# Patient Record
Sex: Female | Born: 1975 | Race: White | Hispanic: No | Marital: Married | Smoking: Never smoker
Health system: Southern US, Community
[De-identification: ages and names within clinical notes are randomized; demographics above are authoritative.]

## PROBLEM LIST (undated history)

## (undated) DIAGNOSIS — E039 Hypothyroidism, unspecified: Secondary | ICD-10-CM

## (undated) DIAGNOSIS — Z9889 Other specified postprocedural states: Secondary | ICD-10-CM

## (undated) DIAGNOSIS — G4700 Insomnia, unspecified: Secondary | ICD-10-CM

## (undated) DIAGNOSIS — F419 Anxiety disorder, unspecified: Secondary | ICD-10-CM

## (undated) DIAGNOSIS — E063 Autoimmune thyroiditis: Secondary | ICD-10-CM

## (undated) DIAGNOSIS — Z87898 Personal history of other specified conditions: Secondary | ICD-10-CM

## (undated) DIAGNOSIS — G43909 Migraine, unspecified, not intractable, without status migrainosus: Secondary | ICD-10-CM

## (undated) DIAGNOSIS — T4145XA Adverse effect of unspecified anesthetic, initial encounter: Secondary | ICD-10-CM

## (undated) DIAGNOSIS — R112 Nausea with vomiting, unspecified: Secondary | ICD-10-CM

## (undated) DIAGNOSIS — R102 Pelvic and perineal pain: Secondary | ICD-10-CM

## (undated) DIAGNOSIS — T8859XA Other complications of anesthesia, initial encounter: Secondary | ICD-10-CM

## (undated) DIAGNOSIS — N920 Excessive and frequent menstruation with regular cycle: Secondary | ICD-10-CM

## (undated) HISTORY — PX: WISDOM TOOTH EXTRACTION: SHX21

## (undated) HISTORY — PX: BREAST REDUCTION SURGERY: SHX8

---

## 2000-08-14 ENCOUNTER — Encounter: Payer: Self-pay | Admitting: Family Medicine

## 2000-08-14 ENCOUNTER — Encounter: Admission: RE | Admit: 2000-08-14 | Discharge: 2000-08-14 | Payer: Self-pay | Admitting: Family Medicine

## 2000-09-01 ENCOUNTER — Emergency Department (HOSPITAL_COMMUNITY): Admission: EM | Admit: 2000-09-01 | Discharge: 2000-09-01 | Payer: Self-pay | Admitting: Emergency Medicine

## 2000-09-01 ENCOUNTER — Encounter: Payer: Self-pay | Admitting: Emergency Medicine

## 2001-01-08 ENCOUNTER — Other Ambulatory Visit: Admission: RE | Admit: 2001-01-08 | Discharge: 2001-01-08 | Payer: Self-pay | Admitting: Family Medicine

## 2001-12-26 ENCOUNTER — Encounter: Admission: RE | Admit: 2001-12-26 | Discharge: 2001-12-26 | Payer: Self-pay | Admitting: Family Medicine

## 2001-12-26 ENCOUNTER — Encounter: Payer: Self-pay | Admitting: Family Medicine

## 2003-05-18 ENCOUNTER — Emergency Department (HOSPITAL_COMMUNITY): Admission: EM | Admit: 2003-05-18 | Discharge: 2003-05-18 | Payer: Self-pay | Admitting: Internal Medicine

## 2003-11-07 ENCOUNTER — Ambulatory Visit (HOSPITAL_COMMUNITY): Admission: RE | Admit: 2003-11-07 | Discharge: 2003-11-07 | Payer: Self-pay | Admitting: Oral Surgery

## 2003-11-07 HISTORY — PX: NASAL SEPTUM SURGERY: SHX37

## 2004-11-04 ENCOUNTER — Inpatient Hospital Stay (HOSPITAL_COMMUNITY): Admission: AD | Admit: 2004-11-04 | Discharge: 2004-11-04 | Payer: Self-pay | Admitting: *Deleted

## 2004-11-04 ENCOUNTER — Inpatient Hospital Stay (HOSPITAL_COMMUNITY): Admission: AD | Admit: 2004-11-04 | Discharge: 2004-11-04 | Payer: Self-pay | Admitting: Obstetrics & Gynecology

## 2004-11-05 ENCOUNTER — Inpatient Hospital Stay (HOSPITAL_COMMUNITY): Admission: AD | Admit: 2004-11-05 | Discharge: 2004-11-05 | Payer: Self-pay | Admitting: Obstetrics and Gynecology

## 2004-11-06 ENCOUNTER — Inpatient Hospital Stay (HOSPITAL_COMMUNITY): Admission: AD | Admit: 2004-11-06 | Discharge: 2004-11-06 | Payer: Self-pay | Admitting: Obstetrics and Gynecology

## 2004-11-09 ENCOUNTER — Observation Stay (HOSPITAL_COMMUNITY): Admission: AD | Admit: 2004-11-09 | Discharge: 2004-11-10 | Payer: Self-pay | Admitting: Obstetrics and Gynecology

## 2004-11-17 ENCOUNTER — Inpatient Hospital Stay (HOSPITAL_COMMUNITY): Admission: AD | Admit: 2004-11-17 | Discharge: 2004-11-17 | Payer: Self-pay | Admitting: Obstetrics & Gynecology

## 2004-11-29 ENCOUNTER — Inpatient Hospital Stay (HOSPITAL_COMMUNITY): Admission: AD | Admit: 2004-11-29 | Discharge: 2004-11-29 | Payer: Self-pay | Admitting: Obstetrics and Gynecology

## 2004-12-12 ENCOUNTER — Inpatient Hospital Stay (HOSPITAL_COMMUNITY): Admission: AD | Admit: 2004-12-12 | Discharge: 2004-12-12 | Payer: Self-pay | Admitting: Obstetrics & Gynecology

## 2004-12-13 ENCOUNTER — Inpatient Hospital Stay (HOSPITAL_COMMUNITY): Admission: AD | Admit: 2004-12-13 | Discharge: 2004-12-15 | Payer: Self-pay | Admitting: Obstetrics & Gynecology

## 2006-02-23 ENCOUNTER — Inpatient Hospital Stay (HOSPITAL_COMMUNITY): Admission: AD | Admit: 2006-02-23 | Discharge: 2006-02-23 | Payer: Self-pay | Admitting: Obstetrics and Gynecology

## 2006-07-06 ENCOUNTER — Inpatient Hospital Stay (HOSPITAL_COMMUNITY): Admission: RE | Admit: 2006-07-06 | Discharge: 2006-07-09 | Payer: Self-pay | Admitting: Obstetrics and Gynecology

## 2007-05-29 ENCOUNTER — Encounter: Admission: RE | Admit: 2007-05-29 | Discharge: 2007-05-29 | Payer: Self-pay | Admitting: Endocrinology

## 2007-06-05 ENCOUNTER — Encounter: Admission: RE | Admit: 2007-06-05 | Discharge: 2007-06-05 | Payer: Self-pay | Admitting: Endocrinology

## 2007-06-05 ENCOUNTER — Other Ambulatory Visit: Admission: RE | Admit: 2007-06-05 | Discharge: 2007-06-05 | Payer: Self-pay | Admitting: Interventional Radiology

## 2007-11-30 ENCOUNTER — Encounter: Admission: RE | Admit: 2007-11-30 | Discharge: 2007-11-30 | Payer: Self-pay | Admitting: Endocrinology

## 2010-01-27 ENCOUNTER — Emergency Department (HOSPITAL_COMMUNITY): Admission: EM | Admit: 2010-01-27 | Discharge: 2010-01-27 | Payer: Self-pay | Admitting: Emergency Medicine

## 2010-08-08 ENCOUNTER — Encounter: Payer: Self-pay | Admitting: Endocrinology

## 2010-08-08 ENCOUNTER — Encounter: Payer: Self-pay | Admitting: Internal Medicine

## 2010-08-09 ENCOUNTER — Encounter: Payer: Self-pay | Admitting: Neurology

## 2010-08-09 ENCOUNTER — Encounter: Payer: Self-pay | Admitting: Endocrinology

## 2010-12-03 NOTE — H&P (Signed)
NAMEANWAR, Alison Gutierrez              ACCOUNT NO.:  [id number]   MEDICAL RECORD NO.:  [id number]          PATIENT TYPE:  OBV   LOCATION:  9156                          FACILITY:  [facility]   PHYSICIAN:  Lenoard Aden, M.D.DATE OF BIRTH:  May 05, 1976   DATE OF ADMISSION:  11/09/2004  DATE OF DISCHARGE:                                HISTORY & PHYSICAL   CHIEF COMPLAINT:  Abdominal pain.   HISTORY OF PRESENT ILLNESS:  A 35 year old white female, G1, P0, EDD of January 09, 2005, at 31-6/7th weeks, with abdominal pain of questionable etiology,  recently hospitalized and given betamethasone for preterm cervical change.  The patient is currently with no active contractions but with ongoing fundal  discomfort which appears to be retroplacental in nature for which she is now  being hospitalized expectantly for further monitoring and management.   MEDICATIONS:  Prenatal vitamins, Zoloft, and thyroid replacement therapy.   She has no known drug allergies.   Pregnancy history is noncontributory.   SURGICAL HISTORY:  1.  Breast reduction.  2.  Septoplasty.   MEDICAL HISTORY:  1.  Migraine headaches.  2.  Anxiety.  3.  Hypothyroidism.   FAMILY HISTORY:  Hypertension, thromboembolic phenomenon, neurovascular  disease, prostate cancer.   PRENATAL LAB DATA:  Reveals a blood type of AB positive.  RH antibody  negative.  Rubella immune.  Hepatitis HIV negative.   PHYSICAL EXAMINATION:  GENERAL:  She is a well-developed, well-nourished,  white female in no acute distress.  HEENT:  Normal.  LUNGS:  Clear.  HEART:  Regular rhythm.  ABDOMEN:  Soft, gravid, and tender in the fundal area to direct palpation.  No rebound or guarding noted.  Negative Murphy sign.  EXTREMITIES:  DTRs 2+.  No evidence of clonus.  PELVIC:  Cervical exam, per Dr. Annabell Howells, 1-cm, 60%, vertex, -2 which is a  stable exam.   IMPRESSION:  1.  A 31-6/7th weeks intrauterine pregnancy.  2.  Abdominal pain of unknown  etiology.   PLAN:  1.  Expectant  management, rule out placental abruption.  2.  We will perform continuous monitoring and manage expectantly at this      time.      RJT/MEDQ  D:  11/09/2004  T:  11/09/2004  Job:  [id number]   cc:   Ma Hillock

## 2010-12-03 NOTE — H&P (Signed)
NAMETEAUNA, DUBACH              ACCOUNT NO.:  [id number]   MEDICAL RECORD NO.:  [id number]          PATIENT TYPE:  INP   LOCATION:  9164                          [facility]ACILITY:  [facility]   PHYSICIAN:  Genia Del, M.D.DATE OF BIRTH:  Apr 10, 1976   DATE OF ADMISSION:  12/13/2004  DATE OF DISCHARGE:                                HISTORY & PHYSICAL   Ms. Yinger is a 35 year old G1, expected date of delivery January 07, 2005 at  36 weeks and 1 day gestation.   REASON FOR ADMISSION:  Spontaneous rupture of membranes with increasing  uterine contractions since around 3 a.m.   HISTORY OF PRESENT ILLNESS:  Clear fluid leak.  The patient did a Nitrazine  at home, which was positive.  Increasing uterine contractions, some bloody  show.  Fetal movements positive.  No PIH symptoms.   PAST MEDICAL HISTORY:  1.  Mild asthma.  2.  History of hyperthyroidism.  3.  Migraines.  4.  Anxiety.  5.  IBS.   PAST SURGICAL HISTORY:  1.  Breast reduction in 2006.  2.  Septorhinoplasty in April 2005.   ALLERGIES:  No known drug allergies.   MEDICATIONS:  1.  Prenatal vitamins.  2.  Synthroid.  3.  Celexa.  4.  Albuterol p.r.n.   SOCIAL HISTORY:  Married, nonsmoker.   HISTORY OF PRESENT PREGNANCY:  First trimester normal labs, AB positive,  antibodies negative.  STD screening negative.  Rubella immune.  Ultrasound  review of anatomy within normal limits.  Triple test declined.  One hour GTT  normal.  Group B Strep negative.  The patient was treated for cystitis  during the third trimester.  Uterine height corresponded well.  The patient  was given Procardia for preterm cervical changes.   REVIEW OF SYSTEMS:  Negative.   PHYSICAL EXAMINATION:  GENERAL:  No apparent distress,except for pain with  uterine contractions.  VITAL SIGNS:  Temperature 97.6, pulse 90, respiratory rate 20, blood  pressure 100/70.  LUNGS:  Clear bilaterally.  CARDIAC:  Regular rhythm.  ABDOMEN:  Gravid uterus with  cephalic presentation.  VAGINAL:  Exam on admission, 4 cm to 5 cm.  Cephalic. -2, -1.  Clear fluid.  Nitrazine and fern positive.  EXTREMITIES:  Lower limbs normal.   Fetal heart rate 140s, good accelerations.  No decelerations.  Uterine  contractions every three to four minutes, moderate.   IMPRESSION:  A G1, [redacted] week gestation, in active labor.  Fetal well being  reassuring.  Group B Strep negative.   PLAN:  1.  Admit to labor and delivery.  2.  Monitoring.  3.  Expectant management toward probable vaginal delivery.       ML/MEDQ  D:  12/13/2004  T:  12/13/2004  Job:  [id number]

## 2010-12-03 NOTE — Op Note (Signed)
NAME:  Alison Gutierrez, Alison Gutierrez                        ACCOUNT NO.:  [id number]   MEDICAL RECORD NO.:  [id number]                   PATIENT TYPE:  OIB   LOCATION:  2550                                 FACILITY:  [facility]   PHYSICIAN:  Dora Sims, M.D.               DATE OF BIRTH:  01-25-1976   DATE OF PROCEDURE:  11/07/2003  DATE OF DISCHARGE:  11/07/2003                                 OPERATIVE REPORT   PREOPERATIVE DIAGNOSIS:  Septal deviation and obstruction, hypertrophied  nasal cartilage.   POSTOPERATIVE DIAGNOSIS:  Septal deviation and obstruction, hypertrophied  nasal cartilage.   PROCEDURE:  Open tip septorhinoplasty.   SURGEON:  Dora Sims, M.D.   ANESTHESIA:  General endotracheal tube anesthesia.   INDICATIONS FOR PROCEDURE:  This is a 35 year old lady who initially  presented to my office for evaluation of nasal obstruction and nasal  hypertrophy.  On evaluation, it was found that she did have a deviated nasal  septum as well as a hypertrophied nasal dorsal hump as well as a wide nasal  bridge and bulbous tip.  After consultation with the patient and later her  husband, appropriate consents were obtained.  The decision was made to bring  the patient to the hospital for an open tip septorhinoplasty.   DESCRIPTION OF PROCEDURE:  The patient was maintained NPO the night before  surgery, brought to the operating room, placed in the supine position.  All  anesthesia monitors were found to be working appropriately.  She was easily  intubated via an oral endotracheal tube.  This was confirmed by clear  bilateral breath sounds as well as positive end tidal CO2.  The patient was  appropriately padded.  She was then prepped with a Hibiclens prep and the  cheek, nasal, and upper lip area and draped in the usual sterile fashion.  The eyes were not taped, however, the Lacrilube was placed in her eyes and  caution was used during the case around her eyes.  At that time, the  approximately 4 inch neuropatties were soaked in Afrin and four were placed  in the right nostril, four were placed in the left nostril and 2% lidocaine  with 1:100,000 parts epinephrine was injected into the nasal dorsum bridge  area as well as into the columella and approximately 1/8 mL into the tip.  After adequate time for vasoconstriction was allowed, during this time a  marking pen was used to demarcate the incision sites, a #15 Bard-Parker  scalpel was used to make the columellar incision extending into the nasal  mucosa around the superior aspect of the nostrils internally.  Converse  scissors and skin hooks were used to begin the elevation and skeletonization  of the lower lateral cartilages.  The medial footplates were initially  encountered and kept intact.  This was then elevated superiorly to expose  the lateral aspect of the lower lateral cartilages.  Blunt dissection  was  continued up the nasal dorsum and upper lateral cartilages were skeletonized  to the nasal bones superiorly.  An Alfresco retractor was then placed  underneath the nasal skin flap exposing the dorsal hump area.  At this time,  the preplanned reduction was reduced using first a 15 Bard-Parker scalpel  through the upper lateral cartilages until it hit nasal bone, at which time  a straight guarded tip osteotome was used to complete the osteotomy  superiorly up to the nasal bones.  After which time, an open roof deformity  was made.  The caudal elevator was used to gently separate the  mucoperiosteum from the septum superiorly.  This was done bilaterally during  the dissection.  The patties were removed from the intranasal area that were  placed preoperatively.  Prior to complete skeletonization of the septal  cartilage, a straight osteotome was used, placed intranasally initially  started at the piriform rim, and a line drawn inferiorly from approximately  the medial canthus.  Osteotome was used superiorly up to  just inferior to  the medial canthal area whereupon it was then curved medially to the nasal  bridge.  This was done also on the left side and the lateral nasal walls  were then infractured with digital pressure.  There was some hangup on the  left side and the osteotome was used to complete the osteotomy cuts, the  hangup was at the superior aspect of this osteotomy.  Once that was  completed, a small amount of upper lateral cartilage was reduced to aid in a  smooth pyramid shape for the nasal dorsum.  5-0 Prolene suture on a cutting  taper needle was used to reapproximate the medial edges of the upper lateral  cartilages.  Only two of these sutures were placed.  Continued  skeletonization of the nasal septum was then performed down to the inferior  aspect of the septum requiring separation of the lower lateral cartilages.  Once this was done, the posterior aspect of the cartilaginous septum was  removed using first a 15 blade and then a caudal elevator and finally a  swivel knife.  The cartilage was harvested for use as a strut graft later in  the procedure and kept in a moist saline soaked gauze.  Adequate width for  an L-shape structure of nasal cartilage was retained in order to maintain  adequate rigidity for the nasal dorsum.  Once this was completed, attention  was focused to the medial footplates of the lower lateral cartilage.  They  were trimmed on their lateral aspect to reduce the columellar hang and a  cephalic trim was done on the lateral aspects of the lower lateral  cartilages as well.  The excised portions of the cartilage were measured so  that equal amounts were taken from both sides.  Once this was performed, the  5-0 Prolene suture on a taper needle was continued down the cartilaginous  nasal dorsum to reapproximate the two edges to close the open roof deformity  and the lower lateral cartilages were sutured to redefine the tip in the nasal dome.  Once this was  performed, the strut graft was cut out of  approximately 18 mm tall x 4 mm deep.  A pocket was dissected bluntly down  to the inferior aspect of the nasal septum.  It was sutured with two 5-0  Prolene sutures to the septum and then the lower medial footplates were  sutured in three different locations bearing the knots  internally so they  would not be palpable.  The skin drape was then brought over the  cartilaginous nasal frame-work to evaluate the shape of the nose.  It was  found to be with good tip projection as well as a symmetric dorsal hump.  A  small amount of rasping was done to smooth any irregularities on the dorsal  hump and the nasal tip was allowed to drape to its natural position.  Doyle  splints were then placed intranasally and held in position with a 3-0 nylon  suture.  This was done with minimal difficulty.  The skin drape was then  closed using 6-0 Prolene sutures in an interrupted fashion to the nasal  mucosa whereupon the nasal mucosa was then closed using 5-0 plain gut  sutures along the superior aspect of the internal incision.  Once this was  done, the patient's nose was cleaned of dried blood and dried.  Tincture  Benzoin was placed over the dorsum.  1/4 inch Steri-Strips were placed  sequentially from the medial canthus down to the tip of the nose increasing  in size going from superior to inferior and Aquaplast splint was then placed  in hot water and held over the nasal dorsum with pressure down over top  compressing the lateral fractured walls as well as decreasing the amount of  dead space underneath the flap.  Once it had cooled, the patient's nose was  cleaned.  Oral cavity was suctioned of any blood.  There was a throat pack  placed at the beginning of the place and it was removed at the end of the  case and there was some blood in the oropharynx that was suctioned with  Yankauer suction.  This patient was given 1 gram of Ancef preoperatively.  She will be  maintained on p.o. Keflex for approximately seven days  postoperatively as well as p.o. pain medicines.  Minimal blood was lost, no  blood was administered, no drains were placed.  Nothing was sent for  pathology.  Two internal Doyle splints were placed and sutured intranasally  as well as an Aquaplast splint over the nasal dorsum.  The patient tolerated  the procedure well.  She will be followed in my office until complete  healing of this rhinoplasty.                                               Dora Sims, M.D.    RJR/MEDQ  D:  11/07/2003  T:  11/09/2003  Job:  [id number]

## 2010-12-03 NOTE — Op Note (Signed)
Alison Gutierrez, Alison Gutierrez              ACCOUNT NO.:  [id number]   MEDICAL RECORD NO.:  [id number]          PATIENT TYPE:  INP   LOCATION:  9168                          FACILITY:  [facility]   PHYSICIAN:  Lenoard Aden, M.D.DATE OF BIRTH:  Jun 28, 1976   DATE OF PROCEDURE:  07/06/2006  DATE OF DISCHARGE:                               OPERATIVE REPORT   PREOPERATIVE DIAGNOSES:  1. Term uterine pregnancy.  2. Active labor.  3. Nonreassuring fetal heart rate tracing.  4. Failed operative vaginal delivery.   POSTOPERATIVE DIAGNOSES:  1. Term uterine pregnancy.  2. Active labor.  3. Nonreassuring fetal heart rate tracing.  4. Failed operative vaginal delivery.  5. Occiput posterior presentation.   PROCEDURE:  Emergent low segment transverse cesarean section.   SURGEON:  Lenoard Aden, M.D.   ASSISTANTS:  1. Gerri Spore B. Earlene Plater, M.D.  2. Marlinda Mike, C.N.M.   ANESTHESIA:  Epidural by Angelica Pou, MD, and Octaviano Glow. Pamalee Leyden,  M.D.   ESTIMATED BLOOD LOSS:  1000 mL.   COMPLICATIONS:  None.   DRAINS:  Foley.   COUNTS:  Correct.   Patient to recovery room in good condition.   FINDINGS:  Full-term living female fetus occiput posterior position,  Apgars 7 and 7.  Cord pH 7.24.  Pediatricians in attendance.   BRIEF OPERATIVE NOTE:  After attempts of low vacuum-assisted vaginal  delivery with kiwi cup for three pulls with no descent of fetal vertex  from +2/+3 station, patient is brought emergently to the operating room  for a nonreassuring tracing.  Fetal heart rate ranging in the 80-100  beat per minute range.  The patient is brought to the operating room  where she is administered a dosing of epidural anesthetic.  Foley  catheter placed.  After achieving adequate anesthesia, a Pfannenstiel  skin incision made with the scalpel, carried down to fascia which is  nicked in the midline and opened transversely using Mayo scissors.  Rectus muscles dissected sharply and bluntly in the  midline.  Peritoneum  entered bluntly.  Bladder blade placed.  Visceral peritoneum scored  sharply off the uterine segment.  Kerr hysterotomy incision made.  Atraumatic delivery of a full-term living female handed to pediatricians  attendance.  Apgars and cord pH as noted.  Placenta manually intact,  three-vessel cord.  Uterus exteriorized, curetted using a dry lap pack  and closed in two running imbricating layers of 0 Monocryl suture.  Additional interrupted sutures placed in the midline for hemostasis.  Bladder flap inspected and found to be hemostatic.  Irrigation  accomplished.  Pericolic gutters irrigated, all blood clots subsequently  removed.  Fascia closed using 0 Monocryl in continuous running fashion.  Skin closed using staples.  The patient tolerates the procedure well and  is transferred to recovery room in good condition.      Lenoard Aden, M.D.  Electronically Signed     RJT/MEDQ  D:  07/06/2006  T:  07/06/2006  Job:  [id number]

## 2010-12-03 NOTE — Discharge Summary (Signed)
NAMEAUBREI, BOUCHIE              ACCOUNT NO.:  [id number]   MEDICAL RECORD NO.:  [id number]          PATIENT TYPE:  INP   LOCATION:  9120                          FACILITY:  [facility]   PHYSICIAN:  Lenoard Aden, M.D.DATE OF BIRTH:  08/31/1975   DATE OF ADMISSION:  07/06/2006  DATE OF DISCHARGE:  07/09/2006                               DISCHARGE SUMMARY   Patient underwent uncomplicated primary low segment transverse cesarean  section; postoperative course uncomplicated.  Tolerating a regular diet.  Will discharge to home day three.  Discharge teaching done.  Prenatal  vitamins, iron, and Tylox given upon discharge.  Follow up in the office  in six weeks.      Lenoard Aden, M.D.  Electronically Signed     RJT/MEDQ  D:  09/05/2006  T:  09/05/2006  Job:  [id number]

## 2011-01-05 ENCOUNTER — Other Ambulatory Visit: Payer: Self-pay | Admitting: Endocrinology

## 2011-01-05 DIAGNOSIS — E042 Nontoxic multinodular goiter: Secondary | ICD-10-CM

## 2011-03-29 ENCOUNTER — Other Ambulatory Visit: Payer: Self-pay

## 2011-07-31 ENCOUNTER — Encounter (HOSPITAL_COMMUNITY): Payer: Self-pay

## 2011-08-01 ENCOUNTER — Encounter (HOSPITAL_COMMUNITY): Payer: Self-pay | Admitting: *Deleted

## 2011-08-02 ENCOUNTER — Other Ambulatory Visit: Payer: Self-pay | Admitting: Obstetrics and Gynecology

## 2011-08-11 NOTE — H&P (Signed)
Alison Gutierrez, BRACH              ACCOUNT NO.:  [id number]  MEDICAL RECORD NO.:  [id number]  LOCATION:  PERIO                         FACILITY:  [facility]  PHYSICIAN:  Lenoard Aden, M.D.DATE OF BIRTH:  1976/02/18  DATE OF ADMISSION:  07/28/2011 DATE OF DISCHARGE:                             HISTORY & PHYSICAL   CHIEF COMPLAINT:  Symptomatic  enlarging vulvar mass.  HISTORY OF PRESENT ILLNESS:  A 36 year old white female G2 , P2 with enlarging vulvar mass with __________ noted.  MEDICATIONS:  Prozac, Klonopin, Synthroid, Zyrtec.  ALLERGIES:  She has allergies to lidocaine __________.  SOCIAL HISTORY:  Nonsmoker, nondrinker.  __________.  FAMILY HISTORY:  History of hypertension, diabetes, stroke, breast cancer, OCD.  __________, history of __________, history of breast reduction __________.  PHYSICAL EXAMINATION:  GENERAL: Well developed, well nourished  female in no acute distress. VITAL SIGNS:  Blood pressure 93/62,  weight of 139 pounds. HEENT:  Normal. NECK:  Supple with full range of motion. LUNGS:  Clear. HEART:  Regular rate and rhythm. ABDOMEN:  Soft and nontender. PELVIC:  Midline perineal  mass along the posterior fourchette area, which is mobile,cystic  in nature __________.  Uterus is __________.  No adnexal masses.  Marland Kitchen  PLAN:  Excision of  vulvar mass with __________.     Lenoard Aden, M.D.     RJT/MEDQ  D:  08/11/2011  T:  08/11/2011  Job:  [id number]

## 2011-08-12 ENCOUNTER — Encounter (HOSPITAL_COMMUNITY): Payer: Self-pay | Admitting: Anesthesiology

## 2011-08-12 ENCOUNTER — Encounter (HOSPITAL_COMMUNITY)
Admission: RE | Disposition: A | Discharge status: Home / Self Care | Payer: Self-pay | Source: Ambulatory Visit | Attending: Obstetrics and Gynecology

## 2011-08-12 ENCOUNTER — Ambulatory Visit (HOSPITAL_COMMUNITY)
Admission: RE | Admit: 2011-08-12 | Discharge: 2011-08-12 | Disposition: A | Discharge status: Home / Self Care | Payer: 59 | Source: Ambulatory Visit | Attending: Obstetrics and Gynecology | Admitting: Obstetrics and Gynecology

## 2011-08-12 DIAGNOSIS — Z538 Procedure and treatment not carried out for other reasons: Secondary | ICD-10-CM | POA: Insufficient documentation

## 2011-08-12 HISTORY — DX: Insomnia, unspecified: G47.00

## 2011-08-12 HISTORY — DX: Nausea with vomiting, unspecified: R11.2

## 2011-08-12 HISTORY — DX: Other specified postprocedural states: Z98.890

## 2011-08-12 HISTORY — DX: Anxiety disorder, unspecified: F41.9

## 2011-08-12 HISTORY — DX: Hypothyroidism, unspecified: E03.9

## 2011-08-12 MED ORDER — PROPOFOL 10 MG/ML IV EMUL
INTRAVENOUS | Status: AC
  Filled 2011-08-12: qty 40

## 2011-08-12 MED ORDER — FENTANYL CITRATE 0.05 MG/ML IJ SOLN
INTRAMUSCULAR | Status: AC
  Filled 2011-08-12: qty 10

## 2011-08-12 MED ORDER — DEXAMETHASONE SODIUM PHOSPHATE 10 MG/ML IJ SOLN
INTRAMUSCULAR | Status: AC
  Filled 2011-08-12: qty 1

## 2011-08-12 MED ORDER — MIDAZOLAM HCL 2 MG/2ML IJ SOLN
INTRAMUSCULAR | Status: AC
  Filled 2011-08-12: qty 4

## 2011-08-12 MED ORDER — ONDANSETRON HCL 4 MG/2ML IJ SOLN
INTRAMUSCULAR | Status: AC
  Filled 2011-08-12: qty 2

## 2011-08-12 MED ORDER — CEFAZOLIN SODIUM 1-5 GM-% IV SOLN
INTRAVENOUS | Status: AC
  Filled 2011-08-12: qty 50

## 2011-08-15 ENCOUNTER — Encounter (HOSPITAL_COMMUNITY): Payer: Self-pay | Admitting: Obstetrics and Gynecology

## 2011-09-30 ENCOUNTER — Encounter (HOSPITAL_COMMUNITY): Admission: RE | Payer: Self-pay | Source: Ambulatory Visit

## 2011-09-30 ENCOUNTER — Ambulatory Visit (HOSPITAL_COMMUNITY)
Admission: RE | Admit: 2011-09-30 | Payer: BC Managed Care – PPO | Source: Ambulatory Visit | Admitting: Obstetrics and Gynecology

## 2011-09-30 SURGERY — VULVAR LESION
Anesthesia: Choice

## 2011-10-07 ENCOUNTER — Encounter (HOSPITAL_COMMUNITY): Payer: Self-pay | Admitting: *Deleted

## 2011-10-24 ENCOUNTER — Other Ambulatory Visit: Payer: Self-pay | Admitting: Obstetrics and Gynecology

## 2011-10-27 ENCOUNTER — Encounter (HOSPITAL_COMMUNITY): Payer: Self-pay

## 2011-11-11 ENCOUNTER — Encounter (HOSPITAL_COMMUNITY): Payer: Self-pay | Admitting: Anesthesiology

## 2011-11-11 ENCOUNTER — Encounter (HOSPITAL_COMMUNITY)
Admission: RE | Disposition: A | Discharge status: Home / Self Care | Payer: Self-pay | Source: Ambulatory Visit | Attending: Obstetrics and Gynecology

## 2011-11-11 ENCOUNTER — Ambulatory Visit (HOSPITAL_COMMUNITY): Payer: 59 | Admitting: Anesthesiology

## 2011-11-11 ENCOUNTER — Ambulatory Visit (HOSPITAL_COMMUNITY)
Admission: RE | Admit: 2011-11-11 | Discharge: 2011-11-11 | Disposition: A | Discharge status: Home / Self Care | Payer: 59 | Source: Ambulatory Visit | Attending: Obstetrics and Gynecology | Admitting: Obstetrics and Gynecology

## 2011-11-11 DIAGNOSIS — N751 Abscess of Bartholin's gland: Secondary | ICD-10-CM

## 2011-11-11 HISTORY — PX: VULVAR LESION REMOVAL: SHX5391

## 2011-11-11 HISTORY — PX: OTHER SURGICAL HISTORY: SHX169

## 2011-11-11 LAB — CBC
Hemoglobin: 12.8 g/dL (ref 12.0–15.0)
MCHC: 33.1 g/dL (ref 30.0–36.0)

## 2011-11-11 LAB — HCG, SERUM, QUALITATIVE: Preg, Serum: NEGATIVE

## 2011-11-11 SURGERY — VULVAR LESION
Anesthesia: Monitor Anesthesia Care | Site: Vulva | Wound class: Clean Contaminated

## 2011-11-11 MED ORDER — OXYCODONE-ACETAMINOPHEN 5-325 MG PO TABS: 1.0000 | ORAL_TABLET | ORAL | Status: AC | PRN

## 2011-11-11 MED ORDER — KETOROLAC TROMETHAMINE 60 MG/2ML IM SOLN
INTRAMUSCULAR | Status: AC
  Filled 2011-11-11: qty 2

## 2011-11-11 MED ORDER — BUPIVACAINE HCL (PF) 0.5 % IJ SOLN
INTRAMUSCULAR | Status: AC
  Filled 2011-11-11: qty 30

## 2011-11-11 MED ORDER — ONDANSETRON HCL 4 MG/2ML IJ SOLN
INTRAMUSCULAR | Status: DC | PRN
  Administered 2011-11-11: 4 mg via INTRAVENOUS

## 2011-11-11 MED ORDER — KETOROLAC TROMETHAMINE 30 MG/ML IJ SOLN
INTRAMUSCULAR | Status: DC | PRN
  Administered 2011-11-11: 60 mg via INTRAVENOUS

## 2011-11-11 MED ORDER — CEFAZOLIN SODIUM 1-5 GM-% IV SOLN
1.0000 g | Freq: Once | INTRAVENOUS | Status: AC
  Administered 2011-11-11: 1 g via INTRAVENOUS

## 2011-11-11 MED ORDER — METOCLOPRAMIDE HCL 5 MG/ML IJ SOLN: 10.0000 mg | Freq: Once | INTRAMUSCULAR | Status: DC | PRN

## 2011-11-11 MED ORDER — LACTATED RINGERS IV SOLN
INTRAVENOUS | Status: DC
  Administered 2011-11-11: 09:00:00 via INTRAVENOUS

## 2011-11-11 MED ORDER — GLYCOPYRROLATE 0.2 MG/ML IJ SOLN
INTRAMUSCULAR | Status: AC
  Filled 2011-11-11: qty 1

## 2011-11-11 MED ORDER — MEPERIDINE HCL 25 MG/ML IJ SOLN: 6.2500 mg | INTRAMUSCULAR | Status: DC | PRN

## 2011-11-11 MED ORDER — FENTANYL CITRATE 0.05 MG/ML IJ SOLN
25.0000 ug | INTRAMUSCULAR | Status: DC | PRN
  Administered 2011-11-11: 50 ug via INTRAVENOUS

## 2011-11-11 MED ORDER — LIDOCAINE HCL (CARDIAC) 20 MG/ML IV SOLN
INTRAVENOUS | Status: AC
  Filled 2011-11-11: qty 5

## 2011-11-11 MED ORDER — FENTANYL CITRATE 0.05 MG/ML IJ SOLN
INTRAMUSCULAR | Status: AC
  Filled 2011-11-11: qty 2

## 2011-11-11 MED ORDER — MIDAZOLAM HCL 2 MG/2ML IJ SOLN
INTRAMUSCULAR | Status: AC
  Filled 2011-11-11: qty 2

## 2011-11-11 MED ORDER — GLYCOPYRROLATE 0.2 MG/ML IJ SOLN
INTRAMUSCULAR | Status: DC | PRN
  Administered 2011-11-11: 0.2 mg via INTRAVENOUS

## 2011-11-11 MED ORDER — VASOPRESSIN 20 UNIT/ML IJ SOLN
INTRAMUSCULAR | Status: AC
  Filled 2011-11-11: qty 1

## 2011-11-11 MED ORDER — FENTANYL CITRATE 0.05 MG/ML IJ SOLN
INTRAMUSCULAR | Status: DC | PRN
  Administered 2011-11-11 (×3): 50 ug via INTRAVENOUS

## 2011-11-11 MED ORDER — CHLOROPROCAINE HCL 1 % IJ SOLN
INTRAMUSCULAR | Status: AC
  Filled 2011-11-11: qty 30

## 2011-11-11 MED ORDER — FENTANYL CITRATE 0.05 MG/ML IJ SOLN
INTRAMUSCULAR | Status: AC
  Filled 2011-11-11: qty 5

## 2011-11-11 MED ORDER — BUPIVACAINE HCL 0.5 % IJ SOLN
INTRAMUSCULAR | Status: DC | PRN
  Administered 2011-11-11: 10 mL

## 2011-11-11 MED ORDER — MIDAZOLAM HCL 5 MG/5ML IJ SOLN
INTRAMUSCULAR | Status: DC | PRN
  Administered 2011-11-11: 2 mg via INTRAVENOUS

## 2011-11-11 MED ORDER — PROPOFOL 10 MG/ML IV EMUL
INTRAVENOUS | Status: AC
  Filled 2011-11-11: qty 20

## 2011-11-11 MED ORDER — KETOROLAC TROMETHAMINE 30 MG/ML IJ SOLN
INTRAMUSCULAR | Status: AC
  Filled 2011-11-11: qty 1

## 2011-11-11 MED ORDER — ONDANSETRON HCL 4 MG/2ML IJ SOLN
INTRAMUSCULAR | Status: AC
  Filled 2011-11-11: qty 2

## 2011-11-11 SURGICAL SUPPLY — 21 items
BLADE SURG 15 STRL LF C SS BP (BLADE) ×1 IMPLANT
BLADE SURG 15 STRL SS (BLADE) ×2
CLOTH BEACON ORANGE TIMEOUT ST (SAFETY) ×2 IMPLANT
CONTAINER PREFILL 10% NBF 15ML (MISCELLANEOUS) ×2 IMPLANT
COUNTER NEEDLE 1200 MAGNETIC (NEEDLE) ×1 IMPLANT
ELECT REM PT RETURN 9FT ADLT (ELECTROSURGICAL) ×2
ELECTRODE REM PT RTRN 9FT ADLT (ELECTROSURGICAL) IMPLANT
GLOVE BIO SURGEON STRL SZ7.5 (GLOVE) ×4 IMPLANT
GOWN PREVENTION PLUS LG XLONG (DISPOSABLE) ×2 IMPLANT
GOWN PREVENTION PLUS XLARGE (GOWN DISPOSABLE) ×2 IMPLANT
NDL SPNL 22GX3.5 QUINCKE BK (NEEDLE) ×1 IMPLANT
NEEDLE SPNL 22GX3.5 QUINCKE BK (NEEDLE) ×2 IMPLANT
NS IRRIG 1000ML POUR BTL (IV SOLUTION) ×2 IMPLANT
PACK VAGINAL MINOR WOMEN LF (CUSTOM PROCEDURE TRAY) ×2 IMPLANT
PAD PREP 24X48 CUFFED NSTRL (MISCELLANEOUS) ×2 IMPLANT
PENCIL BUTTON HOLSTER BLD 10FT (ELECTRODE) ×1 IMPLANT
SUT VICRYL 3 0 RAPIDE (SUTURE) ×1 IMPLANT
SUT VICRYL RAPIDE 2 0 (SUTURE) ×1 IMPLANT
SYR CONTROL 10ML LL (SYRINGE) ×2 IMPLANT
TOWEL OR 17X24 6PK STRL BLUE (TOWEL DISPOSABLE) ×4 IMPLANT
WATER STERILE IRR 1000ML POUR (IV SOLUTION) ×1 IMPLANT

## 2011-11-11 NOTE — Progress Notes (Signed)
Patient ID: Alison Gutierrez, female   DOB: [birth year], 36 y.o.   MRN: [id number] CHIEF COMPLAINT: Symptomatic enlarging vulvar mass.  HISTORY OF PRESENT ILLNESS: A 36 year old white female G2 , P2 with  enlarging vulvar mass with noted.  MEDICATIONS: Prozac, Klonopin, Synthroid, Zyrtec.   ALLERGIES: She has allergies to lidocaine SOCIAL HISTORY: Nonsmoker, nondrinker.   FAMILY HISTORY: History of hypertension, diabetes, stroke, breast  cancer, OCD.  Surgery: history of breast reduction   PHYSICAL EXAMINATION: GENERAL: Well developed, well nourished female  in no acute distress.  VITAL SIGNS: Blood pressure 93/62, weight of 139 pounds.  HEENT: Normal.  NECK: Supple with full range of motion.  LUNGS: Clear.  HEART: Regular rate and rhythm.  ABDOMEN: Soft and nontender.  PELVIC: Midline perineal mass along the posterior fourchette area,  which is mobile,cystic in nature. Uterus is NSSC. No  adnexal masses.  IMP: Enlarging Vulvar mass  PLAN: Excision of vulvar mass.  Lenoard Aden, M.D.

## 2011-11-11 NOTE — H&P (Signed)
[name], Alison Gutierrez              ACCOUNT NO.:  [id number]  MEDICAL RECORD NO.:  [id number]  LOCATION:  PERIO                         FACILITY:  [facility]  PHYSICIAN:  Lenoard Aden, M.D.DATE OF BIRTH:  [birth year]  DATE OF ADMISSION:  10/07/2011 DATE OF DISCHARGE:                             HISTORY & PHYSICAL   CHIEF COMPLAINT:  Enlarging vulvar mass.  HISTORY OF PRESENT ILLNESS:  A 36 year old white female, G2 P2 with a history of an enlarging vulvar mass for removal __________ surgical intervention.  ALLERGIES:  She has allergies to reportedly LIDOCAINE.  MEDICATIONS:  Prozac, Synthroid, Zyrtec p.r.n.  SOCIAL HISTORY:  She is a nonsmoker, nondrinker.  Denies domestic or physical violence.  FAMILY HISTORY:  Hypertension, diabetes, stroke, breast cancer, OCD, kidney disease, history of __________, history of gynoplasty, breast reduction.  PHYSICAL EXAMINATION:  GENERAL:  A well-developed, well-nourished white female. HEENT:  Normal. NECK:  Supple.  Full range of motion. LUNGS:  Clear. ABDOMEN:  Soft and nontender. PELVIC:  Revealed a cystic mass in the right labia majora.  __________, which is thick and mobile.  Uterus is normal size, shape, and contour. No adnexal masses.  IMPRESSION:  Enlarging vulvar mass.  PLAN:  Excision of vulvar mass.  Risks of anesthesia, infection, bleeding, injury to surrounding organs, need for repair is discussed. The patient acknowledges and wishes to proceed.     Lenoard Aden, M.D.     RJT/MEDQ  D:  11/10/2011  T:  11/11/2011  Job:  [id number]

## 2011-11-11 NOTE — Anesthesia Postprocedure Evaluation (Signed)
  Anesthesia Post-op Note  Patient: Alison Gutierrez  Procedure(s) Performed: Procedure(s) (LRB): VULVAR LESION (N/A)  Patient Location: PACU  Anesthesia Type: General  Level of Consciousness: awake, alert  and oriented  Airway and Oxygen Therapy: Patient Spontanous Breathing  Post-op Pain: none  Post-op Assessment: Post-op Vital signs reviewed, Patient's Cardiovascular Status Stable, Respiratory Function Stable, Patent Airway, No signs of Nausea or vomiting and Pain level controlled  Post-op Vital Signs: Reviewed and stable  Complications: No apparent anesthesia complications

## 2011-11-11 NOTE — Progress Notes (Signed)
Patient ID: Alison Gutierrez, female   DOB: Jun 28, 1976, 35 y.o.   MRN: [id number] Patient seen and examined. Consent witnessed and signed. No changes noted. Update completed.

## 2011-11-11 NOTE — Op Note (Signed)
11/11/2011  10:58 AM  PATIENT:  Alison Gutierrez  36 y.o. female  PRE-OPERATIVE DIAGNOSIS:  Vulvar Mass  POST-OPERATIVE DIAGNOSIS:  Bartholins gland abcess  PROCEDURE:  Procedure(s): Excision of right Bartholins Gland  SURGEON:  Surgeon(s): Lenoard Aden, MD  ASSISTANTS: none   ANESTHESIA:   local and general  ESTIMATED BLOOD LOSS: * No blood loss amount entered *   DRAINS: none   LOCAL MEDICATIONS USED:  MARCAINE     SPECIMEN:  Source of Specimen:  Bartholins gland  DISPOSITION OF SPECIMEN:  PATHOLOGY  COUNTS:  YES  DICTATION #: [id number]  PLAN OF CARE: DC home  PATIENT DISPOSITION:  PACU - hemodynamically stable.

## 2011-11-11 NOTE — Anesthesia Preprocedure Evaluation (Signed)
Anesthesia Evaluation  Patient identified by MRN, date of birth, ID band Patient awake    Reviewed: Allergy & Precautions, H&P , NPO status , Patient's Chart, lab work & pertinent test results  History of Anesthesia Complications (+) PONV  Airway Mallampati: II TM Distance: >3 FB Neck ROM: Full    Dental No notable dental hx. (+) Teeth Intact   Pulmonary  breath sounds clear to auscultation  Pulmonary exam normal       Cardiovascular negative cardio ROS  Rhythm:Regular Rate:Normal     Neuro/Psych  Headaches, Anxiety negative psych ROS   GI/Hepatic negative GI ROS, Neg liver ROS,   Endo/Other  Hypothyroidism   Renal/GU negative Renal ROS  negative genitourinary   Musculoskeletal negative musculoskeletal ROS (+)   Abdominal   Peds  Hematology negative hematology ROS (+)   Anesthesia Other Findings   Reproductive/Obstetrics                           Anesthesia Physical Anesthesia Plan  ASA: II  Anesthesia Plan: MAC   Post-op Pain Management:    Induction: Intravenous  Airway Management Planned: Natural Airway  Additional Equipment:   Intra-op Plan:   Post-operative Plan:   Informed Consent: I have reviewed the patients History and Physical, chart, labs and discussed the procedure including the risks, benefits and alternatives for the proposed anesthesia with the patient or authorized representative who has indicated his/her understanding and acceptance.   Dental advisory given  Plan Discussed with: CRNA, Anesthesiologist and Surgeon  Anesthesia Plan Comments:         Anesthesia Quick Evaluation

## 2011-11-11 NOTE — Transfer of Care (Signed)
Immediate Anesthesia Transfer of Care Note  Patient: Alison Gutierrez  Procedure(s) Performed: Procedure(s) (LRB): VULVAR LESION (N/A)  Patient Location: PACU  Anesthesia Type: General  Level of Consciousness: awake  Airway & Oxygen Therapy: Patient Spontanous Breathing and Patient connected to nasal cannula oxygen  Post-op Assessment: Report given to PACU RN and Post -op Vital signs reviewed and stable  Post vital signs: Reviewed and stable  Complications: No apparent anesthesia complications

## 2011-11-12 ENCOUNTER — Inpatient Hospital Stay (HOSPITAL_COMMUNITY)
Admission: AD | Admit: 2011-11-12 | Discharge: 2011-11-12 | Disposition: A | Discharge status: Home / Self Care | Payer: 59 | Source: Ambulatory Visit | Attending: Obstetrics & Gynecology | Admitting: Obstetrics & Gynecology

## 2011-11-12 ENCOUNTER — Encounter (HOSPITAL_COMMUNITY): Payer: Self-pay | Admitting: Obstetrics and Gynecology

## 2011-11-12 DIAGNOSIS — G43909 Migraine, unspecified, not intractable, without status migrainosus: Secondary | ICD-10-CM | POA: Insufficient documentation

## 2011-11-12 LAB — URINALYSIS, ROUTINE W REFLEX MICROSCOPIC
Protein, ur: NEGATIVE mg/dL
Urobilinogen, UA: 0.2 mg/dL (ref 0.0–1.0)

## 2011-11-12 LAB — URINE MICROSCOPIC-ADD ON

## 2011-11-12 MED ORDER — RIZATRIPTAN BENZOATE 10 MG PO TABS: 10.0000 mg | ORAL_TABLET | ORAL | Status: DC | PRN

## 2011-11-12 MED ORDER — PROMETHAZINE HCL 12.5 MG PO TABS: 25.0000 mg | ORAL_TABLET | Freq: Four times a day (QID) | ORAL | Status: DC | PRN

## 2011-11-12 MED ORDER — BUTALBITAL-APAP-CAFFEINE 50-325-40 MG PO TABS
2.0000 | ORAL_TABLET | Freq: Once | ORAL | Status: AC
  Administered 2011-11-12: 2 via ORAL
  Filled 2011-11-12: qty 2

## 2011-11-12 NOTE — MAU Provider Note (Signed)
  History     CSN: [id number]  Arrival date and time: 11/12/11 1724 Nurse call - report received / orders given via phone 11/12/2011 at 1834 Here to see patient 4/27/013 at 1910     Chief Complaint  Patient presents with  . Headache   HPI Headache since early this am - unresponsive to motrin and percocet. + nausea and vomiting with headache. No Abdominal pain or diarrhea. No vision changes / + light sensitivity.  Hx migraine - treated in the past with Zomig with good results. NO rx med at home - no severe migraine in past year. Uses OTC meds mostly.  Recovering from I&D labial abscess - some drainage with light bleeding / + swelling and pain  Past Medical History  Diagnosis Date  . PONV (postoperative nausea and vomiting)   . Hypothyroidism   . Insomnia   . Anxiety   . Headache     otc meds prn    Past Surgical History  Procedure Date  . Cesarean section     x 1  . Svd     x 1  . Wisdom tooth extraction   . Breast surgery     reduction  . Nasal septum surgery     History reviewed. No pertinent family history.  History  Substance Use Topics  . Smoking status: Never Smoker   . Smokeless tobacco: Never Used  . Alcohol Use: Yes     socially    Allergies:  Allergies  Allergen Reactions  . Lidocaine Other (See Comments)    bp went down and she needed epinephrine    Prescriptions prior to admission  Medication Sig Dispense Refill  . acetaminophen (TYLENOL) 500 MG tablet Take 500 mg by mouth as needed. headache      . doxylamine, Sleep, (UNISOM) 25 MG tablet Take 25 mg by mouth at bedtime as needed. For sleep      . FLUoxetine (PROZAC) 40 MG capsule Take 80 mg by mouth daily.      Marland Kitchen ibuprofen (ADVIL,MOTRIN) 200 MG tablet Take 200 mg by mouth every 6 (six) hours as needed. For pain      . levothyroxine (SYNTHROID, LEVOTHROID) 100 MCG tablet Take 100 mcg by mouth daily.      Marland Kitchen oxyCODONE-acetaminophen (ROXICET) 5-325 MG per tablet Take 1-2 tablets by mouth every  4 (four) hours as needed for pain.  40 tablet  0    ROS Physical Exam   Blood pressure 101/70, pulse 56, temperature 98.3 F (36.8 C), temperature source Oral, height 5\' 7"  (1.702 m), weight 66.951 kg (147 lb 9.6 oz).  Physical Exam  Alert & Oriented / NAD Headache better since  Fioricet - 4/10 at this time Some mild nausea  Abdomen soft and non-tender  Labia with mild edema / scant drainage present on peripad  MAU Course  Procedures  Assessment and Plan   Migraine headache  Discharge to home Push oral fluid Supper tonight once home Phenergan prn nausea /vomiting Maxalt 10 mg for migraine tonight at HS - may use 1 in 24 hours  Marlinda Mike 11/12/2011, 7:32 PM

## 2011-11-12 NOTE — Progress Notes (Signed)
Dr. Juliene Pina notified of patient arrival to MAU with complaints of headache 9/10. Dr. Juliene Pina asked me to call Morley Kos CNM

## 2011-11-12 NOTE — Op Note (Signed)
Alison Gutierrez, Alison Gutierrez              ACCOUNT NO.:  [id number]  MEDICAL RECORD NO.:  [id number]  LOCATION:  [facility]                          FACILITY:  [facility]  PHYSICIAN:  Lenoard Aden, M.D.DATE OF BIRTH:  Mar 24, 1976  DATE OF PROCEDURE:  11/11/2011 DATE OF DISCHARGE:  11/11/2011                              OPERATIVE REPORT   DESCRIPTION OF PROCEDURE:  After being apprised of risks of anesthesia, infection, bleeding, injury to abdominal organs and surrounding organs, possible need for repair, the patient was brought to the operating room. She was administered a general anesthetic without complications. Prepped and draped in sterile fashion.  Catheterized until the bladder was empty.  Exam under anesthesia revealed a right sessile 2- to 3-cm vulvar mass projecting from the area of the Bartholin gland; however, this was somewhat decompressed from its previous apparent in the office. At this time, an elliptical incision was made over the mass and the mass was grasped and opened revealing some pustular-type material.  At this point, decision was made to excise the Bartholin gland on the right side due to the recurrence of the cyst.  The cyst mass was then dissected sharply, and the blood supply was isolated and cauterized.  The entire gland was then excised, removed, and sent to pathology.  The area of excision was then closed with deep sutures of 2-0 Vicryl Rapide in an interrupted fashion.  Good hemostasis was achieved using electrocautery and suturing.  After closure of the deep space, the skin over the previous existing mass was closed using a 3-0 Vicryl Rapide in a running subcuticular fashion.  Good hemostasis was noted and dilute Marcaine solution was placed, 0.5%, and 10 mL total.  The patient tolerated the procedure well and was transferred to recovery in good condition.     Lenoard Aden, M.D.     RJT/MEDQ  D:  11/11/2011  T:  11/12/2011  Job:  [id number]

## 2011-11-12 NOTE — Progress Notes (Signed)
Orders received, on her way.

## 2011-11-12 NOTE — MAU Note (Signed)
Had vulvar mass removed yesterday of severe headache about 10:00 this morning has taking Percocet which has not helped had general anesthesia having nausea.

## 2011-11-12 NOTE — Discharge Instructions (Signed)
Migraine Headache  A migraine is very bad pain on one or both sides of your head. The cause of a migraine is not always known. A migraine can be triggered or caused by different things, such as:   Alcohol.   Smoking.   Stress.   Periods (menstruation) in women.   Aged cheeses.   Foods or drinks that contain nitrates, glutamate, aspartame, or tyramine.   Lack of sleep.   Chocolate.   Caffeine.   Hunger.   Medicines, such as nitroglycerine (used to treat chest pain), birth control pills, estrogen, and some blood pressure medicines.  HOME CARE   Many medicines can help migraine pain or keep migraines from coming back. Your doctor can help you decide on a medicine or treatment program.   If you or your child gets a migraine, it may help to lie down in a dark, quiet room.   Keep a headache journal. This may help find out what is causing the headaches. For example, write down:   What you eat and drink.   How much sleep you get.   Any change to your diet or medicines.  GET HELP RIGHT AWAY IF:    The medicine does not work.   The pain begins again.   The neck is stiff.   You have trouble seeing.   The muscles are weak or you lose muscle control.   You have new symptoms.   You lose your balance.   You have trouble walking.   You feel faint or pass out.  MAKE SURE YOU:    Understand these instructions.   Will watch this condition.   Will get help right away if you are not doing well or get worse.  Document Released: 04/12/2008 Document Revised: 06/23/2011 Document Reviewed: 03/09/2009  ExitCare Patient Information 2012 [employer].

## 2011-11-14 ENCOUNTER — Encounter (HOSPITAL_COMMUNITY): Payer: Self-pay | Admitting: Obstetrics and Gynecology

## 2013-02-27 ENCOUNTER — Other Ambulatory Visit: Payer: Self-pay | Admitting: Endocrinology

## 2013-02-27 DIAGNOSIS — E041 Nontoxic single thyroid nodule: Secondary | ICD-10-CM

## 2013-03-08 ENCOUNTER — Other Ambulatory Visit: Payer: BC Managed Care – PPO

## 2013-06-19 ENCOUNTER — Encounter (HOSPITAL_COMMUNITY)
Admission: RE | Admit: 2013-06-19 | Discharge: 2013-06-19 | Disposition: A | Discharge status: Home / Self Care | Payer: Commercial Managed Care - PPO | Source: Ambulatory Visit | Attending: Obstetrics & Gynecology | Admitting: Obstetrics & Gynecology

## 2013-06-19 ENCOUNTER — Encounter (HOSPITAL_COMMUNITY): Payer: Self-pay

## 2013-06-19 DIAGNOSIS — Z01812 Encounter for preprocedural laboratory examination: Secondary | ICD-10-CM | POA: Insufficient documentation

## 2013-06-19 DIAGNOSIS — Z01818 Encounter for other preprocedural examination: Secondary | ICD-10-CM | POA: Insufficient documentation

## 2013-06-19 LAB — CBC
HCT: 37.4 % (ref 36.0–46.0)
MCV: 91.7 fL (ref 78.0–100.0)
RBC: 4.08 MIL/uL (ref 3.87–5.11)
RDW: 12.6 % (ref 11.5–15.5)
WBC: 5.6 10*3/uL (ref 4.0–10.5)

## 2013-06-19 NOTE — Patient Instructions (Addendum)
   Your procedure is scheduled on: Friday, Dec 12  Enter through the [employer] [facility] at: 6 AM Pick up the phone at the desk and dial [phone] and inform us of your arrival.  Please call this number if you have any problems the morning of surgery: [phone]  Remember: Do not eat or drink after midnight: Thursday Take these medicines the morning of surgery with a SIP OF WATER:  Prozac, synthroid  Do not wear jewelry, make-up, or FINGER nail polish No metal in your hair or on your body. Do not wear lotions, powders, perfumes. You may wear deodorant.  Please use your CHG wash as directed prior to surgery.  Do not shave anywhere for at least 12 hours prior to first CHG shower.  Do not bring valuables to the hospital. Contacts, dentures or bridgework may not be worn into surgery.  Patients discharged on the day of surgery will not be allowed to drive home.  Home with husband Jonny Ruiz cell [phone].

## 2013-06-27 NOTE — H&P (Signed)
Alison Gutierrez is an 37 y.o. female with menorrhagia which has failed medical management.  She has completed childbearing but declines hysterectomy.    Pertinent Gynecological History: Menses: flow is excessive with use of 8 pads or tampons on heaviest days Bleeding: dysfunctional uterine bleeding Contraception: NuvaRing vaginal inserts DES exposure: unknown Blood transfusions: none Sexually transmitted diseases: no past history Previous GYN Procedures: C/S  Last mammogram: n/a Date: n/a Last pap: normal Date: 04/2013 OB History: G2, P2   Menstrual History: Menarche age: 38 No LMP recorded.    Past Medical History  Diagnosis Date  . PONV (postoperative nausea and vomiting)   . Hypothyroidism   . Insomnia   . Anxiety   . Headache(784.0)     otc meds prn  . SVD (spontaneous vaginal delivery)     x 1    Past Surgical History  Procedure Laterality Date  . Cesarean section      x 1  . Svd      x 1  . Wisdom tooth extraction    . Breast surgery      reduction  . Nasal septum surgery    . Vulvar lesion removal  11/11/2011    Procedure: VULVAR LESION;  Surgeon: Lenoard Aden, MD;  Location: [facility];  Service: Gynecology;  Laterality: N/A;  Excision    No family history on file.  Social History:  reports that she has never smoked. She has never used smokeless tobacco. She reports that she drinks alcohol. She reports that she does not use illicit drugs.  Allergies:  Allergies  Allergen Reactions  . Lidocaine Shortness Of Breath and Other (See Comments)    bp went down and she needed epinephrine    No prescriptions prior to admission    ROS  There were no vitals taken for this visit. Physical Exam  Constitutional: She is oriented to person, place, and time. She appears well-developed and well-nourished.  Cardiovascular: Normal rate and regular rhythm.   Respiratory: Effort normal and breath sounds normal.  GI: Soft. There is no rebound and no guarding.   Neurological: She is alert and oriented to person, place, and time.  Skin: Skin is warm and dry.  Psychiatric: She has a normal mood and affect. Her behavior is normal.    No results found for this or any previous visit (from the past 24 hour(s)).  No results found.  Assessment/Plan: 37 yo G2P2 with menorrhagia and desire for permanent sterilization -Essure with Thermachoice  Veta Dambrosia 06/27/2013, 9:10 PM

## 2013-06-28 ENCOUNTER — Ambulatory Visit (HOSPITAL_COMMUNITY)
Admission: RE | Admit: 2013-06-28 | Discharge: 2013-06-28 | Disposition: A | Discharge status: Home / Self Care | Payer: Commercial Managed Care - PPO | Source: Ambulatory Visit | Attending: Obstetrics & Gynecology | Admitting: Obstetrics & Gynecology

## 2013-06-28 ENCOUNTER — Ambulatory Visit (HOSPITAL_COMMUNITY): Payer: Commercial Managed Care - PPO | Admitting: Anesthesiology

## 2013-06-28 ENCOUNTER — Encounter (HOSPITAL_COMMUNITY)
Admission: RE | Disposition: A | Discharge status: Home / Self Care | Payer: Self-pay | Source: Ambulatory Visit | Attending: Obstetrics & Gynecology

## 2013-06-28 ENCOUNTER — Encounter (HOSPITAL_COMMUNITY): Payer: Self-pay | Admitting: Anesthesiology

## 2013-06-28 ENCOUNTER — Encounter (HOSPITAL_COMMUNITY): Payer: Commercial Managed Care - PPO | Admitting: Anesthesiology

## 2013-06-28 DIAGNOSIS — Z302 Encounter for sterilization: Secondary | ICD-10-CM | POA: Insufficient documentation

## 2013-06-28 DIAGNOSIS — N920 Excessive and frequent menstruation with regular cycle: Secondary | ICD-10-CM | POA: Insufficient documentation

## 2013-06-28 DIAGNOSIS — R9389 Abnormal findings on diagnostic imaging of other specified body structures: Secondary | ICD-10-CM | POA: Insufficient documentation

## 2013-06-28 HISTORY — PX: TUBAL LIGATION: SHX77

## 2013-06-28 HISTORY — PX: DILITATION & CURRETTAGE/HYSTROSCOPY WITH THERMACHOICE ABLATION: SHX5569

## 2013-06-28 SURGERY — ESSURE TUBAL STERILIZATION
Anesthesia: General | Site: Uterus

## 2013-06-28 MED ORDER — MIDAZOLAM HCL 2 MG/2ML IJ SOLN
INTRAMUSCULAR | Status: DC | PRN
  Administered 2013-06-28: 2 mg via INTRAVENOUS

## 2013-06-28 MED ORDER — FENTANYL CITRATE 0.05 MG/ML IJ SOLN
INTRAMUSCULAR | Status: DC | PRN
  Administered 2013-06-28: 100 ug via INTRAVENOUS

## 2013-06-28 MED ORDER — SODIUM CHLORIDE 0.9 % IR SOLN
Status: DC | PRN
  Administered 2013-06-28 (×2): 3000 mL

## 2013-06-28 MED ORDER — PROPOFOL 10 MG/ML IV EMUL
INTRAVENOUS | Status: AC
  Filled 2013-06-28: qty 20

## 2013-06-28 MED ORDER — MEPERIDINE HCL 25 MG/ML IJ SOLN: 6.2500 mg | INTRAMUSCULAR | Status: DC | PRN

## 2013-06-28 MED ORDER — ONDANSETRON HCL 4 MG/2ML IJ SOLN
INTRAMUSCULAR | Status: DC | PRN
  Administered 2013-06-28: 4 mg via INTRAVENOUS

## 2013-06-28 MED ORDER — MIDAZOLAM HCL 2 MG/2ML IJ SOLN
INTRAMUSCULAR | Status: AC
  Filled 2013-06-28: qty 2

## 2013-06-28 MED ORDER — GLYCOPYRROLATE 0.2 MG/ML IJ SOLN
INTRAMUSCULAR | Status: DC | PRN
  Administered 2013-06-28 (×2): 0.1 mg via INTRAVENOUS

## 2013-06-28 MED ORDER — GLYCOPYRROLATE 0.2 MG/ML IJ SOLN
INTRAMUSCULAR | Status: AC
  Filled 2013-06-28: qty 1

## 2013-06-28 MED ORDER — DEXAMETHASONE SODIUM PHOSPHATE 10 MG/ML IJ SOLN
INTRAMUSCULAR | Status: AC
  Filled 2013-06-28: qty 1

## 2013-06-28 MED ORDER — SCOPOLAMINE 1 MG/3DAYS TD PT72
MEDICATED_PATCH | TRANSDERMAL | Status: AC
  Administered 2013-06-28: 1.5 mg via TRANSDERMAL
  Filled 2013-06-28: qty 1

## 2013-06-28 MED ORDER — FENTANYL CITRATE 0.05 MG/ML IJ SOLN: 25.0000 ug | INTRAMUSCULAR | Status: DC | PRN

## 2013-06-28 MED ORDER — LACTATED RINGERS IV SOLN
INTRAVENOUS | Status: DC
  Administered 2013-06-28 (×2): via INTRAVENOUS

## 2013-06-28 MED ORDER — FENTANYL CITRATE 0.05 MG/ML IJ SOLN
INTRAMUSCULAR | Status: AC
  Filled 2013-06-28: qty 5

## 2013-06-28 MED ORDER — KETOROLAC TROMETHAMINE 30 MG/ML IJ SOLN: 15.0000 mg | Freq: Once | INTRAMUSCULAR | Status: DC | PRN

## 2013-06-28 MED ORDER — KETOROLAC TROMETHAMINE 30 MG/ML IJ SOLN
INTRAMUSCULAR | Status: AC
  Filled 2013-06-28: qty 1

## 2013-06-28 MED ORDER — METOCLOPRAMIDE HCL 5 MG/ML IJ SOLN: 10.0000 mg | Freq: Once | INTRAMUSCULAR | Status: DC | PRN

## 2013-06-28 MED ORDER — OXYCODONE-ACETAMINOPHEN 5-325 MG PO TABS: 1.0000 | ORAL_TABLET | ORAL | Status: DC | PRN

## 2013-06-28 MED ORDER — ONDANSETRON HCL 4 MG/2ML IJ SOLN
INTRAMUSCULAR | Status: AC
  Filled 2013-06-28: qty 2

## 2013-06-28 MED ORDER — DEXTROSE IN LACTATED RINGERS 5 % IV SOLN: INTRAVENOUS | Status: DC

## 2013-06-28 MED ORDER — SCOPOLAMINE 1 MG/3DAYS TD PT72
1.0000 | MEDICATED_PATCH | TRANSDERMAL | Status: DC
  Administered 2013-06-28: 1.5 mg via TRANSDERMAL

## 2013-06-28 MED ORDER — DEXAMETHASONE SODIUM PHOSPHATE 4 MG/ML IJ SOLN
INTRAMUSCULAR | Status: DC | PRN
  Administered 2013-06-28: 10 mg via INTRAVENOUS

## 2013-06-28 MED ORDER — IBUPROFEN 800 MG PO TABS: 800.0000 mg | ORAL_TABLET | Freq: Four times a day (QID) | ORAL | Status: DC | PRN

## 2013-06-28 MED ORDER — PROPOFOL 10 MG/ML IV BOLUS
INTRAVENOUS | Status: DC | PRN
  Administered 2013-06-28: 200 mg via INTRAVENOUS

## 2013-06-28 MED ORDER — KETOROLAC TROMETHAMINE 30 MG/ML IJ SOLN
INTRAMUSCULAR | Status: DC | PRN
  Administered 2013-06-28: 30 mg via INTRAVENOUS

## 2013-06-28 SURGICAL SUPPLY — 16 items
CANISTER SUCT 3000ML (MISCELLANEOUS) ×3 IMPLANT
CATH ROBINSON RED A/P 16FR (CATHETERS) ×3 IMPLANT
CATH THERMACHOICE III (CATHETERS) ×2 IMPLANT
CLOTH BEACON ORANGE TIMEOUT ST (SAFETY) ×3 IMPLANT
CONTAINER PREFILL 10% NBF 60ML (FORM) ×4 IMPLANT
DILATOR CANAL MILEX (MISCELLANEOUS) ×1 IMPLANT
DRESSING TELFA 8X3 (GAUZE/BANDAGES/DRESSINGS) ×3 IMPLANT
GLOVE BIO SURGEON STRL SZ 6 (GLOVE) ×3 IMPLANT
GLOVE BIOGEL PI IND STRL 6 (GLOVE) ×4 IMPLANT
GLOVE BIOGEL PI INDICATOR 6 (GLOVE) ×2
GOWN STRL REIN XL XLG (GOWN DISPOSABLE) ×6 IMPLANT
KIT ESSURE FALLOPIAN CLOSURE (Ring) ×3 IMPLANT
PACK HYSTEROSCOPY LF (CUSTOM PROCEDURE TRAY) ×3 IMPLANT
PAD OB MATERNITY 4.3X12.25 (PERSONAL CARE ITEMS) ×3 IMPLANT
TOWEL OR 17X24 6PK STRL BLUE (TOWEL DISPOSABLE) ×6 IMPLANT
WATER STERILE IRR 1000ML POUR (IV SOLUTION) ×2 IMPLANT

## 2013-06-28 NOTE — Progress Notes (Signed)
No change to H&P. 

## 2013-06-28 NOTE — Op Note (Signed)
PREOPERATIVE DIAGNOSIS:  37 y.o. with desire for permanent sterilization and menorrhagia  POSTOPERATIVE DIAGNOSIS: The same  PROCEDURE: Hysteroscopy, Endometrial sampling, Left Essure coil placement.  SURGEON:  Dr. Mitchel Honour  INDICATIONS: 37 y.o. G2P2  here for scheduled surgery for Hysteroscopy, Essure, and Thermachoice.   Risks of surgery were discussed with the patient including but not limited to: bleeding which may require transfusion; infection which may require antibiotics; injury to uterus or surrounding organs; intrauterine scarring which may impair future fertility; need for additional procedures including laparotomy or laparoscopy; and other postoperative/anesthesia complications. Written informed consent was obtained.    FINDINGS:  A 6 week size uterus.  Diffuse proliferative endometrium.  Normal left tubal ostium.  Unable to visualize right tubal ostium secondary to thickened endometrium  ANESTHESIA:   General  ESTIMATED BLOOD LOSS:  Less than 20 ml  SPECIMENS: Endometrial sample sent to pathology  COMPLICATIONS:  None immediate.  PROCEDURE DETAILS:  The patient was taken to the operating room where general anesthesia was administered and was found to be adequate.  After an adequate timeout was performed, she was placed in the dorsal lithotomy position and examined; then prepped and draped in the sterile manner.   Her bladder was catheterized for an unmeasured amount of clear, yellow urine. A speculum was then placed in the patient's vagina and a single tooth tenaculum was applied to the anterior lip of the cervix. The uteus was sounded to 8 cm and dilated manually with metal dilators to accommodate the 5.5 mm diagnostic hysteroscope.  Once the cervix was dilated, the hysteroscope was inserted under direct visualization using saline as a suspension medium.  The uterine cavity was carefully examined, both ostia were recognized.   The Essure was opened.  The left coil was placed  easily with 0 trailing coils but the device was clearly visible within the tube.  Attention was turned to the right tube and the area previously identified as ostium was actually a dimple in the endometrium.  Because of the thick lining, the right ostium was unidentifiable after approximately 20 minutes of searching.  Attempts was made with a directed sampling of the endometrium to clear the right cornu but this was unsuccessful. The decision was made to end the procedure at this point. The tenaculum was removed from the anterior lip of the cervix and the vaginal speculum was removed after noting good hemostasis.  The patient tolerated the procedure well and was taken to the recovery area awake, extubated and in stable condition.

## 2013-06-28 NOTE — Anesthesia Postprocedure Evaluation (Signed)
Anesthesia Post Note  Patient: Alison Gutierrez  Procedure(s) Performed: Procedure(s) (LRB): ESSURE TUBAL STERILIZATION (Bilateral) DILATATION & CURETTAGE/HYSTEROSCOPY (N/A)  Anesthesia type: General  Patient location: PACU  Post pain: Pain level controlled  Post assessment: Post-op Vital signs reviewed  Last Vitals:  Filed Vitals:   06/28/13 0849  BP: 111/69  Pulse: 76  Temp: 36.4 C  Resp: 16    Post vital signs: Reviewed  Level of consciousness: sedated  Complications: No apparent anesthesia complications

## 2013-06-28 NOTE — Anesthesia Preprocedure Evaluation (Signed)
Anesthesia Evaluation  Patient identified by MRN, date of birth, ID band Patient awake    Reviewed: Allergy & Precautions, H&P , NPO status , Patient's Chart, lab work & pertinent test results, reviewed documented beta blocker date and time   History of Anesthesia Complications (+) PONV and history of anesthetic complications  Airway Mallampati: II      Dental  (+) Teeth Intact   Pulmonary neg pulmonary ROS,  breath sounds clear to auscultation  Pulmonary exam normal       Cardiovascular Exercise Tolerance: Good negative cardio ROS  Rhythm:regular Rate:Normal     Neuro/Psych  Headaches (none recently), PSYCHIATRIC DISORDERS (anxiety)    GI/Hepatic negative GI ROS, Neg liver ROS,   Endo/Other  Hypothyroidism   Renal/GU negative Renal ROS  Female GU complaint     Musculoskeletal   Abdominal   Peds  Hematology negative hematology ROS (+)   Anesthesia Other Findings Lidocaine "allergy" was a drop in BP when dosed in epidural for C/S  Reproductive/Obstetrics negative OB ROS                           Anesthesia Physical Anesthesia Plan  ASA: II  Anesthesia Plan: General LMA   Post-op Pain Management:    Induction:   Airway Management Planned:   Additional Equipment:   Intra-op Plan:   Post-operative Plan:   Informed Consent: I have reviewed the patients History and Physical, chart, labs and discussed the procedure including the risks, benefits and alternatives for the proposed anesthesia with the patient or authorized representative who has indicated his/her understanding and acceptance.   Dental Advisory Given  Plan Discussed with: CRNA and Surgeon  Anesthesia Plan Comments:         Anesthesia Quick Evaluation

## 2013-06-28 NOTE — Transfer of Care (Signed)
Immediate Anesthesia Transfer of Care Note  Patient: Alison Gutierrez  Procedure(s) Performed: Procedure(s): ESSURE TUBAL STERILIZATION (Bilateral) DILATATION & CURETTAGE/HYSTEROSCOPY (N/A)  Patient Location: PACU  Anesthesia Type:General  Level of Consciousness: awake, alert , oriented and patient cooperative  Airway & Oxygen Therapy: Patient Spontanous Breathing and Patient connected to nasal cannula oxygen  Post-op Assessment: Report given to PACU RN and Post -op Vital signs reviewed and stable  Post vital signs: Reviewed and stable  Complications: No apparent anesthesia complications

## 2013-07-01 ENCOUNTER — Encounter (HOSPITAL_COMMUNITY): Payer: Self-pay | Admitting: Obstetrics & Gynecology

## 2013-07-09 NOTE — H&P (Signed)
Alison Gutierrez is an 37 y.o. female with menorrhagia and desire for permanent sterilization.  She underwent attempt at Essure with placement only of the left coil secondary to thick endometrial tissue and inability to visualize the right tubal ostium.  The patient elected to proceed to laparoscopic BTL and Thermachoice.  Pertinent Gynecological History: Menses: flow is excessive with use of 8 pads or tampons on heaviest days Bleeding: dysfunctional uterine bleeding Contraception: NuvaRing vaginal inserts DES exposure: unknown Blood transfusions: none Sexually transmitted diseases: no past history Previous GYN Procedures: c/s  Last mammogram: n/a Date: n/a Last pap: normal Date: 04/2013 OB History: G2, P2   Menstrual History: Menarche age: 89 No LMP recorded.    Past Medical History  Diagnosis Date  . PONV (postoperative nausea and vomiting)   . Hypothyroidism   . Insomnia   . Anxiety   . Headache(784.0)     otc meds prn  . SVD (spontaneous vaginal delivery)     x 1    Past Surgical History  Procedure Laterality Date  . Cesarean section      x 1  . Svd      x 1  . Wisdom tooth extraction    . Breast surgery      reduction  . Nasal septum surgery    . Vulvar lesion removal  11/11/2011    Procedure: VULVAR LESION;  Surgeon: Lenoard Aden, MD;  Location: [facility];  Service: Gynecology;  Laterality: N/A;  Excision  . Tubal ligation Bilateral 06/28/2013    Procedure: ESSURE TUBAL STERILIZATION;  Surgeon: Mitchel Honour, DO;  Location: [facility];  Service: Gynecology;  Laterality: Bilateral;  . Dilitation & currettage/hystroscopy with thermachoice ablation N/A 06/28/2013    Procedure: DILATATION & CURETTAGE/HYSTEROSCOPY;  Surgeon: Mitchel Honour, DO;  Location: [facility];  Service: Gynecology;  Laterality: N/A;    No family history on file.  Social History:  reports that she has never smoked. She has never used smokeless tobacco. She reports that she drinks alcohol. She reports  that she does not use illicit drugs.  Allergies:  Allergies  Allergen Reactions  . Lidocaine Shortness Of Breath and Other (See Comments)    bp went down and she needed epinephrine    No prescriptions prior to admission    ROS  There were no vitals taken for this visit. Physical Exam  Constitutional: She is oriented to person, place, and time. She appears well-developed and well-nourished.  Cardiovascular: Normal rate and regular rhythm.   Respiratory: Effort normal and breath sounds normal.  GI: Soft. There is no tenderness. There is no rebound.  Neurological: She is alert and oriented to person, place, and time.  Skin: Skin is warm and dry.  Psychiatric: She has a normal mood and affect. Her behavior is normal.    No results found for this or any previous visit (from the past 24 hour(s)).  No results found.  Assessment/Plan: 37yo G2P2 with menorrhagia, desire for permanent sterilization -Thermachoice, l/s BTL with Filshie clips  Coralie Stanke 07/09/2013, 7:31 PM

## 2013-07-10 ENCOUNTER — Ambulatory Visit (HOSPITAL_COMMUNITY): Payer: Commercial Managed Care - PPO | Admitting: Anesthesiology

## 2013-07-10 ENCOUNTER — Encounter (HOSPITAL_COMMUNITY): Payer: Self-pay | Admitting: Anesthesiology

## 2013-07-10 ENCOUNTER — Encounter (HOSPITAL_COMMUNITY): Payer: Commercial Managed Care - PPO | Admitting: Anesthesiology

## 2013-07-10 ENCOUNTER — Encounter (HOSPITAL_COMMUNITY): Payer: Self-pay | Admitting: *Deleted

## 2013-07-10 ENCOUNTER — Encounter (HOSPITAL_COMMUNITY)
Admission: RE | Disposition: A | Discharge status: Home / Self Care | Payer: Self-pay | Source: Ambulatory Visit | Attending: Obstetrics & Gynecology

## 2013-07-10 ENCOUNTER — Ambulatory Visit (HOSPITAL_COMMUNITY)
Admission: RE | Admit: 2013-07-10 | Discharge: 2013-07-10 | Disposition: A | Discharge status: Home / Self Care | Payer: Commercial Managed Care - PPO | Source: Ambulatory Visit | Attending: Obstetrics & Gynecology | Admitting: Obstetrics & Gynecology

## 2013-07-10 ENCOUNTER — Inpatient Hospital Stay (HOSPITAL_COMMUNITY)
Admission: AD | Admit: 2013-07-10 | Discharge: 2013-07-10 | Disposition: A | Discharge status: Home / Self Care | Payer: Commercial Managed Care - PPO | Source: Ambulatory Visit | Attending: Obstetrics and Gynecology | Admitting: Obstetrics and Gynecology

## 2013-07-10 DIAGNOSIS — R339 Retention of urine, unspecified: Secondary | ICD-10-CM | POA: Insufficient documentation

## 2013-07-10 DIAGNOSIS — N920 Excessive and frequent menstruation with regular cycle: Secondary | ICD-10-CM | POA: Insufficient documentation

## 2013-07-10 DIAGNOSIS — Z302 Encounter for sterilization: Secondary | ICD-10-CM | POA: Insufficient documentation

## 2013-07-10 HISTORY — PX: LAPAROSCOPIC TUBAL LIGATION: SHX1937

## 2013-07-10 HISTORY — PX: DILITATION & CURRETTAGE/HYSTROSCOPY WITH THERMACHOICE ABLATION: SHX5569

## 2013-07-10 LAB — URINALYSIS, ROUTINE W REFLEX MICROSCOPIC
Hgb urine dipstick: NEGATIVE
Nitrite: NEGATIVE
Protein, ur: NEGATIVE mg/dL
Urobilinogen, UA: 0.2 mg/dL (ref 0.0–1.0)
pH: 6 (ref 5.0–8.0)

## 2013-07-10 SURGERY — LIGATION, FALLOPIAN TUBE, LAPAROSCOPIC
Anesthesia: General

## 2013-07-10 MED ORDER — EPHEDRINE SULFATE 50 MG/ML IJ SOLN
INTRAMUSCULAR | Status: DC | PRN
  Administered 2013-07-10 (×2): 10 mg via INTRAVENOUS
  Administered 2013-07-10: 5 mg via INTRAVENOUS

## 2013-07-10 MED ORDER — GLYCOPYRROLATE 0.2 MG/ML IJ SOLN
INTRAMUSCULAR | Status: DC | PRN
  Administered 2013-07-10: .6 mg via INTRAVENOUS

## 2013-07-10 MED ORDER — KETOROLAC TROMETHAMINE 30 MG/ML IJ SOLN
INTRAMUSCULAR | Status: DC | PRN
  Administered 2013-07-10: 30 mg via INTRAVENOUS

## 2013-07-10 MED ORDER — KETOROLAC TROMETHAMINE 30 MG/ML IJ SOLN
INTRAMUSCULAR | Status: AC
  Filled 2013-07-10: qty 1

## 2013-07-10 MED ORDER — DEXTROSE 5 % IV SOLN
INTRAVENOUS | Status: DC | PRN
  Administered 2013-07-10: 250 mL

## 2013-07-10 MED ORDER — PROPOFOL 10 MG/ML IV BOLUS
INTRAVENOUS | Status: DC | PRN
  Administered 2013-07-10: 150 mg via INTRAVENOUS

## 2013-07-10 MED ORDER — DEXTROSE IN LACTATED RINGERS 5 % IV SOLN: INTRAVENOUS | Status: DC

## 2013-07-10 MED ORDER — EPHEDRINE 5 MG/ML INJ
INTRAVENOUS | Status: AC
  Filled 2013-07-10: qty 10

## 2013-07-10 MED ORDER — LIDOCAINE HCL (CARDIAC) 20 MG/ML IV SOLN
INTRAVENOUS | Status: DC | PRN
  Administered 2013-07-10: 80 mg via INTRAVENOUS

## 2013-07-10 MED ORDER — MIDAZOLAM HCL 2 MG/2ML IJ SOLN
INTRAMUSCULAR | Status: DC | PRN
  Administered 2013-07-10: 2 mg via INTRAVENOUS

## 2013-07-10 MED ORDER — ONDANSETRON HCL 4 MG/2ML IJ SOLN: 4.0000 mg | Freq: Once | INTRAMUSCULAR | Status: DC | PRN

## 2013-07-10 MED ORDER — BUPIVACAINE HCL (PF) 0.5 % IJ SOLN
INTRAMUSCULAR | Status: AC
  Filled 2013-07-10: qty 30

## 2013-07-10 MED ORDER — KETOROLAC TROMETHAMINE 30 MG/ML IJ SOLN: 15.0000 mg | Freq: Once | INTRAMUSCULAR | Status: DC | PRN

## 2013-07-10 MED ORDER — PROPOFOL 10 MG/ML IV EMUL
INTRAVENOUS | Status: AC
  Filled 2013-07-10: qty 20

## 2013-07-10 MED ORDER — ROCURONIUM BROMIDE 100 MG/10ML IV SOLN
INTRAVENOUS | Status: AC
  Filled 2013-07-10: qty 1

## 2013-07-10 MED ORDER — ONDANSETRON HCL 4 MG/2ML IJ SOLN
INTRAMUSCULAR | Status: AC
  Filled 2013-07-10: qty 2

## 2013-07-10 MED ORDER — FENTANYL CITRATE 0.05 MG/ML IJ SOLN
INTRAMUSCULAR | Status: DC | PRN
  Administered 2013-07-10 (×5): 50 ug via INTRAVENOUS

## 2013-07-10 MED ORDER — DEXAMETHASONE SODIUM PHOSPHATE 10 MG/ML IJ SOLN
INTRAMUSCULAR | Status: AC
  Filled 2013-07-10: qty 1

## 2013-07-10 MED ORDER — DEXAMETHASONE SODIUM PHOSPHATE 10 MG/ML IJ SOLN
INTRAMUSCULAR | Status: DC | PRN
  Administered 2013-07-10: 10 mg via INTRAVENOUS

## 2013-07-10 MED ORDER — LACTATED RINGERS IV SOLN
INTRAVENOUS | Status: DC
  Administered 2013-07-10 (×2): via INTRAVENOUS

## 2013-07-10 MED ORDER — NEOSTIGMINE METHYLSULFATE 1 MG/ML IJ SOLN
INTRAMUSCULAR | Status: DC | PRN
  Administered 2013-07-10: 3 mg via INTRAVENOUS

## 2013-07-10 MED ORDER — ONDANSETRON HCL 4 MG/2ML IJ SOLN
INTRAMUSCULAR | Status: DC | PRN
  Administered 2013-07-10: 4 mg via INTRAVENOUS

## 2013-07-10 MED ORDER — FENTANYL CITRATE 0.05 MG/ML IJ SOLN
INTRAMUSCULAR | Status: AC
  Filled 2013-07-10: qty 5

## 2013-07-10 MED ORDER — MEPERIDINE HCL 25 MG/ML IJ SOLN: 6.2500 mg | INTRAMUSCULAR | Status: DC | PRN

## 2013-07-10 MED ORDER — GLYCOPYRROLATE 0.2 MG/ML IJ SOLN
INTRAMUSCULAR | Status: AC
  Filled 2013-07-10: qty 3

## 2013-07-10 MED ORDER — SCOPOLAMINE 1 MG/3DAYS TD PT72
1.0000 | MEDICATED_PATCH | TRANSDERMAL | Status: DC
  Administered 2013-07-10: 1.5 mg via TRANSDERMAL

## 2013-07-10 MED ORDER — NEOSTIGMINE METHYLSULFATE 1 MG/ML IJ SOLN
INTRAMUSCULAR | Status: AC
  Filled 2013-07-10: qty 1

## 2013-07-10 MED ORDER — FENTANYL CITRATE 0.05 MG/ML IJ SOLN
25.0000 ug | INTRAMUSCULAR | Status: DC | PRN
  Administered 2013-07-10: 0.5 ug via INTRAVENOUS

## 2013-07-10 MED ORDER — BUPIVACAINE HCL (PF) 0.25 % IJ SOLN
INTRAMUSCULAR | Status: AC
  Filled 2013-07-10: qty 30

## 2013-07-10 MED ORDER — MIDAZOLAM HCL 2 MG/2ML IJ SOLN
INTRAMUSCULAR | Status: AC
  Filled 2013-07-10: qty 2

## 2013-07-10 MED ORDER — SCOPOLAMINE 1 MG/3DAYS TD PT72
MEDICATED_PATCH | TRANSDERMAL | Status: AC
  Filled 2013-07-10: qty 1

## 2013-07-10 MED ORDER — FENTANYL CITRATE 0.05 MG/ML IJ SOLN
INTRAMUSCULAR | Status: AC
  Administered 2013-07-10: 0.5 ug via INTRAVENOUS
  Filled 2013-07-10: qty 2

## 2013-07-10 MED ORDER — LIDOCAINE HCL (CARDIAC) 20 MG/ML IV SOLN
INTRAVENOUS | Status: AC
  Filled 2013-07-10: qty 5

## 2013-07-10 MED ORDER — ROCURONIUM BROMIDE 100 MG/10ML IV SOLN
INTRAVENOUS | Status: DC | PRN
  Administered 2013-07-10: 40 mg via INTRAVENOUS

## 2013-07-10 SURGICAL SUPPLY — 31 items
ADH SKN CLS APL DERMABOND .7 (GAUZE/BANDAGES/DRESSINGS) ×2
CANISTER SUCT 3000ML (MISCELLANEOUS) ×3 IMPLANT
CATH ROBINSON RED A/P 16FR (CATHETERS) ×3 IMPLANT
CATH THERMACHOICE III (CATHETERS) ×3 IMPLANT
CHLORAPREP W/TINT 26ML (MISCELLANEOUS) ×6 IMPLANT
CLIP FILSHIE TUBAL LIGA STRL (Clip) ×1 IMPLANT
CLOTH BEACON ORANGE TIMEOUT ST (SAFETY) ×3 IMPLANT
CONTAINER PREFILL 10% NBF 60ML (FORM) ×6 IMPLANT
DERMABOND ADVANCED (GAUZE/BANDAGES/DRESSINGS) ×1
DERMABOND ADVANCED .7 DNX12 (GAUZE/BANDAGES/DRESSINGS) ×2 IMPLANT
DRSG TELFA 3X8 NADH (GAUZE/BANDAGES/DRESSINGS) ×3 IMPLANT
GLOVE BIO SURGEON STRL SZ 6 (GLOVE) ×3 IMPLANT
GLOVE BIOGEL PI IND STRL 6 (GLOVE) ×4 IMPLANT
GLOVE BIOGEL PI INDICATOR 6 (GLOVE) ×2
GOWN PREVENTION PLUS LG XLONG (DISPOSABLE) ×6 IMPLANT
GOWN STRL REIN XL XLG (GOWN DISPOSABLE) ×6 IMPLANT
NEEDLE INSUFFLATION 120MM (ENDOMECHANICALS) ×1 IMPLANT
PACK HYSTEROSCOPY LF (CUSTOM PROCEDURE TRAY) ×3 IMPLANT
PACK LAPAROSCOPY BASIN (CUSTOM PROCEDURE TRAY) ×3 IMPLANT
PAD DRESSING TELFA 3X8 NADH (GAUZE/BANDAGES/DRESSINGS) ×2 IMPLANT
PAD OB MATERNITY 4.3X12.25 (PERSONAL CARE ITEMS) ×3 IMPLANT
SET IRRIG TUBING LAPAROSCOPIC (IRRIGATION / IRRIGATOR) IMPLANT
SUT VIC AB 3-0 PS2 18 (SUTURE) ×3
SUT VIC AB 3-0 PS2 18XBRD (SUTURE) ×2 IMPLANT
SUT VICRYL 0 UR6 27IN ABS (SUTURE) ×3 IMPLANT
TOWEL OR 17X24 6PK STRL BLUE (TOWEL DISPOSABLE) ×6 IMPLANT
TROCAR OPTI TIP 5M 100M (ENDOMECHANICALS) IMPLANT
TROCAR XCEL NON-BLD 11X100MML (ENDOMECHANICALS) ×3 IMPLANT
TROCAR XCEL OPT SLVE 5M 100M (ENDOMECHANICALS) IMPLANT
WARMER LAPAROSCOPE (MISCELLANEOUS) ×3 IMPLANT
WATER STERILE IRR 1000ML POUR (IV SOLUTION) ×3 IMPLANT

## 2013-07-10 NOTE — Transfer of Care (Signed)
Immediate Anesthesia Transfer of Care Note  Patient: ALLEXA ACOFF  Procedure(s) Performed: Procedure(s): LAPAROSCOPIC TUBAL LIGATION with Filshie Clips (Bilateral) DILATATION & CURETTAGE/HYSTEROSCOPY WITH THERMACHOICE ABLATION (N/A)  Patient Location: PACU  Anesthesia Type:General  Level of Consciousness: awake, alert  and oriented  Airway & Oxygen Therapy: Patient Spontanous Breathing and Patient connected to nasal cannula oxygen  Post-op Assessment: Report given to PACU RN, Post -op Vital signs reviewed and stable and Patient moving all extremities  Post vital signs: Reviewed and stable  Complications: No apparent anesthesia complications

## 2013-07-10 NOTE — MAU Note (Signed)
To place indwelling foley with a leg bag. Pt to be dc'd with catheter, give syringe and instruct how to remove. Pt to remove tomorrow- if unable to void after removal- pt to return to MAU

## 2013-07-10 NOTE — Op Note (Signed)
PREOPERATIVE DIAGNOSIS:  37 y.o. with menorrhagia and desire for permanent sterilization  POSTOPERATIVE DIAGNOSIS: The same  PROCEDURE: Thermachoice, laparoscopic tubal ligation with Filshie clips  SURGEON:  Dr. Mitchel Honour  INDICATIONS: 37 y.o. G2P2  here for scheduled surgery for Thermachoice and laparoscopic tubal ligation.   Risks of surgery were discussed with the patient including but not limited to: bleeding which may require transfusion; infection which may require antibiotics; injury to uterus or surrounding organs; intrauterine scarring; need for additional procedures including laparotomy or laparoscopy; failure of tubal ligation, regret, and other postoperative/anesthesia complications. Written informed consent was obtained.    FINDINGS:  A uterus sounding to 8 cm.  Dense adhesions of the anterior uterus to the bladder.  Normal fallopian tubes and ovaries bilaterally.  ANESTHESIA:   General  ESTIMATED BLOOD LOSS:  25cc  SPECIMENS: none  COMPLICATIONS:  None immediate.  PROCEDURE DETAILS:  The patient was taken to the operating room where general anesthesia was administered and was found to be adequate.  After an adequate timeout was performed, she was placed in the dorsal lithotomy position and examined; then prepped and draped in the sterile manner.   Her bladder was catheterized for an unmeasured amount of clear, yellow urine. A speculum was then placed in the patient's vagina and a single tooth tenaculum was applied to the anterior lip of the cervix. The uteus was sounded to 8 cm.  The Thermachoice was opened and balloon tested per manufacturer's protocol.  The Thermacoice was advanced into the uterine cavity and test cycle was run.  The 8 minute ablation cycle was then completed keeping the pressure between 160 and 180 mm Hg.  Following the cooling cycle, the Thermachoice was removed. The tenaculum was removed from the anterior lip of the cervix and the Hulka uterine manipulator  was placed.  Vaginal speculum was removed and attention was turned to the abdomen.    A 10-mm skin incision was made in the umbilical fold. A Veres needle was inserted into the abdomen via Z technique and postion confirmed by saline drop test. Next, pneumoperitoneum was created with carbon dioxide.  The Veres needle was removed and a 10-mm trocar was placed in the umbilical incision followed by confirmation of proper placement by direct visualization with the camera.  A survey of the abdomen showed no injury to the structures below the insertion sites and the above dictated findings. A Filshie clip was advanced through the operative scope and placed first on the right tube approximately 3 cm from the cornual region and being mindful to incorporate the entire tubal circumference.  The left tube was treated in the same fashion.  Note was taken, on the left, of the Essure coil in the proximal tube. A final survey of the abdomen revealed no bleeding. The instruments were then removed from the patient's abdomen and the fascial incision was closed using 2-0 Vicryl. The skin incision was closed using 3-0 Vicryl in a subcuticular fashion.  A band-aid was applied to the infraumbilical skin incision.  The uterine manipulator was removed from the cervix and hemostasis of the cervix was assured. The patient tolerated the procedure well. Sponge and lap counts were correct x2.

## 2013-07-10 NOTE — Progress Notes (Signed)
No change to H&P. 

## 2013-07-10 NOTE — Anesthesia Preprocedure Evaluation (Signed)
Anesthesia Evaluation  Patient identified by MRN, date of birth, ID band Patient awake    Reviewed: Allergy & Precautions, H&P , NPO status , Patient's Chart, lab work & pertinent test results  Airway Mallampati: I TM Distance: >3 FB Neck ROM: full    Dental no notable dental hx. (+) Teeth Intact   Pulmonary    Pulmonary exam normal       Cardiovascular negative cardio ROS      Neuro/Psych negative psych ROS   GI/Hepatic negative GI ROS, Neg liver ROS,   Endo/Other  Hypothyroidism   Renal/GU negative Renal ROS     Musculoskeletal negative musculoskeletal ROS (+)   Abdominal Normal abdominal exam  (+)   Peds  Hematology negative hematology ROS (+)   Anesthesia Other Findings   Reproductive/Obstetrics negative OB ROS                           Anesthesia Physical Anesthesia Plan  ASA: II  Anesthesia Plan: General   Post-op Pain Management:    Induction: Intravenous  Airway Management Planned: Oral ETT  Additional Equipment:   Intra-op Plan:   Post-operative Plan: Extubation in OR  Informed Consent: I have reviewed the patients History and Physical, chart, labs and discussed the procedure including the risks, benefits and alternatives for the proposed anesthesia with the patient or authorized representative who has indicated his/her understanding and acceptance.   Dental Advisory Given  Plan Discussed with: CRNA, Surgeon and Anesthesiologist  Anesthesia Plan Comments:         Anesthesia Quick Evaluation

## 2013-07-10 NOTE — Anesthesia Postprocedure Evaluation (Signed)
  Anesthesia Post-op Note  Anesthesia Post Note  Patient: Alison Gutierrez  Procedure(s) Performed: Procedure(s) (LRB): LAPAROSCOPIC TUBAL LIGATION with Filshie Clips (Bilateral) DILATATION & CURETTAGE/HYSTEROSCOPY WITH THERMACHOICE ABLATION (N/A)  Anesthesia type: General  Patient location: PACU  Post pain: Pain level controlled  Post assessment: Post-op Vital signs reviewed  Last Vitals:  Filed Vitals:   07/10/13 0954  BP:   Pulse:   Temp: 36.9 C  Resp:     Post vital signs: Reviewed  Level of consciousness: sedated  Complications: No apparent anesthesia complications

## 2013-07-10 NOTE — MAU Note (Signed)
Pt states she had surgery 0730. Pt was discharged home about 1030. Last time pt was able voided was 0630 prior to surgery

## 2013-07-10 NOTE — Preoperative (Signed)
Beta Blockers   Reason not to administer Beta Blockers:Not Applicable 

## 2013-07-10 NOTE — OR Nursing (Signed)
Discharge instructions reviewed with pt husband over the phone by pt dawn harvell rn

## 2013-07-10 NOTE — MAU Note (Signed)
Had tubal and ablation this morning, has not been able to pee since.

## 2013-07-12 ENCOUNTER — Encounter (HOSPITAL_COMMUNITY): Payer: Self-pay | Admitting: Obstetrics & Gynecology

## 2014-04-23 ENCOUNTER — Other Ambulatory Visit: Payer: Self-pay | Admitting: Obstetrics & Gynecology

## 2014-04-24 LAB — CYTOLOGY - PAP

## 2014-05-19 ENCOUNTER — Encounter (HOSPITAL_COMMUNITY): Payer: Self-pay | Admitting: Obstetrics & Gynecology

## 2014-07-31 ENCOUNTER — Encounter (HOSPITAL_COMMUNITY): Payer: Self-pay | Admitting: Obstetrics and Gynecology

## 2014-11-13 ENCOUNTER — Ambulatory Visit (HOSPITAL_COMMUNITY)
Admission: RE | Admit: 2014-11-13 | Payer: Commercial Managed Care - PPO | Source: Ambulatory Visit | Admitting: Obstetrics & Gynecology

## 2014-11-13 ENCOUNTER — Encounter (HOSPITAL_COMMUNITY): Admission: RE | Payer: Self-pay | Source: Ambulatory Visit

## 2014-11-13 SURGERY — SALPINGECTOMY, BILATERAL, LAPAROSCOPIC
Anesthesia: General | Laterality: Bilateral

## 2014-12-25 ENCOUNTER — Encounter (HOSPITAL_COMMUNITY): Payer: Self-pay | Admitting: Obstetrics and Gynecology

## 2015-01-30 ENCOUNTER — Encounter (HOSPITAL_BASED_OUTPATIENT_CLINIC_OR_DEPARTMENT_OTHER): Payer: Self-pay | Admitting: *Deleted

## 2015-01-30 DIAGNOSIS — Z820 Family history of epilepsy and other diseases of the nervous system: Secondary | ICD-10-CM | POA: Diagnosis not present

## 2015-01-30 DIAGNOSIS — Z823 Family history of stroke: Secondary | ICD-10-CM | POA: Diagnosis not present

## 2015-01-30 DIAGNOSIS — F419 Anxiety disorder, unspecified: Secondary | ICD-10-CM | POA: Diagnosis present

## 2015-01-30 DIAGNOSIS — R339 Retention of urine, unspecified: Secondary | ICD-10-CM | POA: Diagnosis not present

## 2015-01-30 DIAGNOSIS — Z884 Allergy status to anesthetic agent status: Secondary | ICD-10-CM | POA: Diagnosis not present

## 2015-01-30 DIAGNOSIS — N946 Dysmenorrhea, unspecified: Secondary | ICD-10-CM | POA: Diagnosis present

## 2015-01-30 DIAGNOSIS — E063 Autoimmune thyroiditis: Secondary | ICD-10-CM | POA: Diagnosis present

## 2015-01-30 DIAGNOSIS — N939 Abnormal uterine and vaginal bleeding, unspecified: Secondary | ICD-10-CM | POA: Diagnosis present

## 2015-01-30 DIAGNOSIS — N926 Irregular menstruation, unspecified: Secondary | ICD-10-CM | POA: Diagnosis present

## 2015-01-30 LAB — CBC
HCT: 39.4 % (ref 36.0–46.0)
Hemoglobin: 13.4 g/dL (ref 12.0–15.0)
MCH: 31.2 pg (ref 26.0–34.0)
MCHC: 34 g/dL (ref 30.0–36.0)
MCV: 91.6 fL (ref 78.0–100.0)
Platelets: 240 10*3/uL (ref 150–400)
RBC: 4.3 MIL/uL (ref 3.87–5.11)
RDW: 12.8 % (ref 11.5–15.5)
WBC: 4.9 10*3/uL (ref 4.0–10.5)

## 2015-01-30 LAB — COMPREHENSIVE METABOLIC PANEL
ALT: 14 U/L (ref 14–54)
AST: 19 U/L (ref 15–41)
Albumin: 4 g/dL (ref 3.5–5.0)
Alkaline Phosphatase: 59 U/L (ref 38–126)
Anion gap: 4 — ABNORMAL LOW (ref 5–15)
BUN: 14 mg/dL (ref 6–20)
CO2: 29 mmol/L (ref 22–32)
Calcium: 8.8 mg/dL — ABNORMAL LOW (ref 8.9–10.3)
Chloride: 105 mmol/L (ref 101–111)
Creatinine, Ser: 0.76 mg/dL (ref 0.44–1.00)
GFR calc Af Amer: >60 mL/min (ref >60)
GLUCOSE: 97 mg/dL (ref 65–99)
Potassium: 4.1 mmol/L (ref 3.5–5.1)
SODIUM: 138 mmol/L (ref 135–145)
Total Bilirubin: 0.4 mg/dL (ref 0.3–1.2)
Total Protein: 7.6 g/dL (ref 6.5–8.1)

## 2015-01-30 NOTE — Progress Notes (Signed)
NPO AFTER MN.  ARRIVE AT 0600. NEEDS T & S.  GETTING LAB WORK DONE TODAY .  WILL TAKE AM MEDS W/ SIPS OF WATER DOS AND IF NEEDED TAKE ZOMIG.

## 2015-01-31 NOTE — H&P (Signed)
Alison Gutierrez is an 39 y.o. female with heavy, irregular menstrual bleeding and dysmenorrhea.  The patient previously had hysteroscopy, D&C, endometrial ablation 06/2013 with temporary improvement.  Symptoms returned with worsening over time with pain localized on the left.  The patient declines hormonal treatment and wants definitive management.    Pertinent Gynecological History: Menses: flow is excessive with use of 6 pads or tampons on heaviest days and with severe dysmenorrhea Bleeding: dysfunctional uterine bleeding Contraception: tubal ligation DES exposure: unknown Blood transfusions: none Sexually transmitted diseases: no past history Previous GYN Procedures: h/s, D&C, Ablation, BTL, C/S  Last mammogram: n/a Date: n/a Last pap: normal Date: 04/2014 OB History: G2P2   Menstrual History: Menarche age: n/a Patient's last menstrual period was 01/30/2015 (exact date).    Past Medical History  Diagnosis Date  . Insomnia   . Anxiety   . Hashimoto's disease   . Migraine   . History of urinary retention     POST-OP EVERY SURGERY W/ ANESTHESIA  AND C/S  . Complication of anesthesia     POST-OP  URINARY RETENTION  . PONV (postoperative nausea and vomiting)   . Heavy menstrual bleeding   . Pelvic pain in female     Past Surgical History  Procedure Laterality Date  . Cesarean section  07-06-2006  . Wisdom tooth extraction    . Nasal septum surgery  11-07-2003  . Vulvar lesion removal  11/11/2011    Procedure: VULVAR LESION;  Surgeon: Lenoard Adenichard J Taavon, MD;  Location: [facility];  Service: Gynecology;  Laterality: N/A;  Excision  . Tubal ligation Bilateral 06/28/2013    Procedure: ESSURE TUBAL STERILIZATION;  Surgeon: Mitchel HonourMegan Briena Swingler, DO;  Location: [facility];  Service: Gynecology;  Laterality: Bilateral;  . Dilitation & currettage/hystroscopy with thermachoice ablation N/A 06/28/2013    Procedure: DILATATION & CURETTAGE/HYSTEROSCOPY;  Surgeon: Mitchel HonourMegan Kagan Mutchler, DO;  Location: [facility];   Service: Gynecology;  Laterality: N/A;  . Laparoscopic tubal ligation Bilateral 07/10/2013    Procedure: LAPAROSCOPIC TUBAL LIGATION with Filshie Clips;  Surgeon: Mitchel HonourMegan Robena Ewy, DO;  Location: [facility];  Service: Gynecology;  Laterality: Bilateral;  . Dilitation & currettage/hystroscopy with thermachoice ablation N/A 07/10/2013    Procedure: DILATATION & CURETTAGE/HYSTEROSCOPY WITH THERMACHOICE ABLATION;  Surgeon: Mitchel HonourMegan Oriya Kettering, DO;  Location: [facility];  Service: Gynecology;  Laterality: N/A;  . Excision right bartholin gland  11-11-2011  . Breast reduction surgery  age 39    Family History  Problem Relation Age of Onset  . Epilepsy Mother   . Stroke Maternal Grandmother     Social History:  reports that she has never smoked. She has never used smokeless tobacco. She reports that she drinks alcohol. She reports that she does not use illicit drugs.  Allergies:  Allergies  Allergen Reactions  . Lidocaine Shortness Of Breath and Other (See Comments)    bp went down and she needed epinephrine    No prescriptions prior to admission    ROS  Height 5\' 6"  (1.676 m), weight 150 lb (68.04 kg), last menstrual period 01/30/2015. Physical Exam  Constitutional: She is oriented to person, place, and time. She appears well-developed and well-nourished.  GI: Soft. There is no rebound and no guarding.  Neurological: She is alert and oriented to person, place, and time.  Skin: Skin is warm and dry.  Psychiatric: She has a normal mood and affect. Her behavior is normal.    No results found for this or any previous visit (from the past 24 hour(s)).  No results found.  Assessment/Plan: 39yo G2P2 with heavy, irregular menstrual bleeding and dysmenorrhea -LAVH, LSO -Patient is counseled re: risk of bleeding, infection, scarring and damage to surrounding structures.  Patient understands the risk of needing to convert to open procedure.  All questions were answered and the patient wishes to proceed.    Ibrohim Simmers 01/31/2015, 3:36 PM

## 2015-02-03 ENCOUNTER — Encounter (HOSPITAL_BASED_OUTPATIENT_CLINIC_OR_DEPARTMENT_OTHER): Payer: Self-pay | Admitting: *Deleted

## 2015-02-03 ENCOUNTER — Ambulatory Visit (HOSPITAL_BASED_OUTPATIENT_CLINIC_OR_DEPARTMENT_OTHER): Payer: Commercial Managed Care - PPO | Admitting: Anesthesiology

## 2015-02-03 ENCOUNTER — Inpatient Hospital Stay (HOSPITAL_BASED_OUTPATIENT_CLINIC_OR_DEPARTMENT_OTHER)
Admission: AD | Admit: 2015-02-03 | Discharge: 2015-02-05 | DRG: 743 | Disposition: A | Discharge status: Home / Self Care | Payer: Commercial Managed Care - PPO | Source: Ambulatory Visit | Attending: Obstetrics & Gynecology | Admitting: Obstetrics & Gynecology

## 2015-02-03 ENCOUNTER — Encounter (HOSPITAL_COMMUNITY)
Admission: AD | Disposition: A | Discharge status: Home / Self Care | Payer: Self-pay | Source: Ambulatory Visit | Attending: Obstetrics & Gynecology

## 2015-02-03 DIAGNOSIS — Z820 Family history of epilepsy and other diseases of the nervous system: Secondary | ICD-10-CM | POA: Diagnosis not present

## 2015-02-03 DIAGNOSIS — Z884 Allergy status to anesthetic agent status: Secondary | ICD-10-CM | POA: Diagnosis not present

## 2015-02-03 DIAGNOSIS — R339 Retention of urine, unspecified: Secondary | ICD-10-CM | POA: Diagnosis not present

## 2015-02-03 DIAGNOSIS — Z90710 Acquired absence of both cervix and uterus: Secondary | ICD-10-CM | POA: Diagnosis present

## 2015-02-03 DIAGNOSIS — N946 Dysmenorrhea, unspecified: Secondary | ICD-10-CM | POA: Diagnosis present

## 2015-02-03 DIAGNOSIS — N926 Irregular menstruation, unspecified: Secondary | ICD-10-CM | POA: Diagnosis present

## 2015-02-03 DIAGNOSIS — E063 Autoimmune thyroiditis: Secondary | ICD-10-CM | POA: Diagnosis present

## 2015-02-03 DIAGNOSIS — N939 Abnormal uterine and vaginal bleeding, unspecified: Secondary | ICD-10-CM | POA: Diagnosis present

## 2015-02-03 DIAGNOSIS — Z823 Family history of stroke: Secondary | ICD-10-CM

## 2015-02-03 DIAGNOSIS — F419 Anxiety disorder, unspecified: Secondary | ICD-10-CM | POA: Diagnosis present

## 2015-02-03 DIAGNOSIS — Z90721 Acquired absence of ovaries, unilateral: Secondary | ICD-10-CM

## 2015-02-03 HISTORY — DX: Pelvic and perineal pain: R10.2

## 2015-02-03 HISTORY — DX: Autoimmune thyroiditis: E06.3

## 2015-02-03 HISTORY — PX: SALPINGOOPHORECTOMY: SHX82

## 2015-02-03 HISTORY — DX: Excessive and frequent menstruation with regular cycle: N92.0

## 2015-02-03 HISTORY — PX: ABDOMINAL HYSTERECTOMY: SHX81

## 2015-02-03 HISTORY — DX: Personal history of other specified conditions: Z87.898

## 2015-02-03 HISTORY — DX: Adverse effect of unspecified anesthetic, initial encounter: T41.45XA

## 2015-02-03 HISTORY — DX: Other complications of anesthesia, initial encounter: T88.59XA

## 2015-02-03 HISTORY — DX: Migraine, unspecified, not intractable, without status migrainosus: G43.909

## 2015-02-03 HISTORY — PX: LAPAROSCOPIC ASSISTED VAGINAL HYSTERECTOMY: SHX5398

## 2015-02-03 LAB — TYPE AND SCREEN
ABO/RH(D): AB POS
Antibody Screen: NEGATIVE

## 2015-02-03 LAB — ABO/RH: ABO/RH(D): AB POS

## 2015-02-03 LAB — POCT PREGNANCY, URINE: PREG TEST UR: NEGATIVE

## 2015-02-03 SURGERY — HYSTERECTOMY, VAGINAL, LAPAROSCOPY-ASSISTED
Anesthesia: General | Site: Abdomen

## 2015-02-03 MED ORDER — DEXTROSE IN LACTATED RINGERS 5 % IV SOLN
INTRAVENOUS | Status: DC
  Filled 2015-02-03: qty 1000

## 2015-02-03 MED ORDER — HYDROMORPHONE HCL 1 MG/ML IJ SOLN
0.5000 mg | INTRAMUSCULAR | Status: AC | PRN
  Administered 2015-02-03 (×4): 0.5 mg via INTRAVENOUS
  Filled 2015-02-03: qty 1

## 2015-02-03 MED ORDER — ONDANSETRON HCL 4 MG/2ML IJ SOLN
4.0000 mg | Freq: Four times a day (QID) | INTRAMUSCULAR | Status: DC | PRN
  Filled 2015-02-03: qty 2

## 2015-02-03 MED ORDER — LACTATED RINGERS IV SOLN
INTRAVENOUS | Status: DC
  Administered 2015-02-03 (×2): via INTRAVENOUS
  Filled 2015-02-03: qty 1000

## 2015-02-03 MED ORDER — HYDROMORPHONE HCL 1 MG/ML IJ SOLN
INTRAMUSCULAR | Status: AC
  Filled 2015-02-03: qty 1

## 2015-02-03 MED ORDER — GLYCOPYRROLATE 0.2 MG/ML IJ SOLN
INTRAMUSCULAR | Status: DC | PRN
  Administered 2015-02-03: .8 mg via INTRAVENOUS

## 2015-02-03 MED ORDER — KETOROLAC TROMETHAMINE 30 MG/ML IJ SOLN
INTRAMUSCULAR | Status: AC
  Filled 2015-02-03: qty 1

## 2015-02-03 MED ORDER — FENTANYL CITRATE (PF) 100 MCG/2ML IJ SOLN
INTRAMUSCULAR | Status: DC | PRN
  Administered 2015-02-03 (×2): 50 ug via INTRAVENOUS
  Administered 2015-02-03: 100 ug via INTRAVENOUS

## 2015-02-03 MED ORDER — LEVOTHYROXINE SODIUM 125 MCG PO TABS
125.0000 ug | ORAL_TABLET | Freq: Every day | ORAL | Status: DC
Start: 2015-02-04 — End: 2015-02-05
  Administered 2015-02-04 – 2015-02-05 (×2): 125 ug via ORAL
  Filled 2015-02-03 (×4): qty 1

## 2015-02-03 MED ORDER — METHYLENE BLUE 1 % INJ SOLN
INTRAMUSCULAR | Status: DC | PRN
  Administered 2015-02-03: 1 mL

## 2015-02-03 MED ORDER — HYDROMORPHONE HCL 1 MG/ML IJ SOLN
0.5000 mg | INTRAMUSCULAR | Status: DC | PRN
  Administered 2015-02-03 (×2): 0.5 mg via INTRAVENOUS
  Filled 2015-02-03: qty 1

## 2015-02-03 MED ORDER — BISACODYL 5 MG PO TBEC
5.0000 mg | DELAYED_RELEASE_TABLET | Freq: Every day | ORAL | Status: DC | PRN
  Administered 2015-02-03: 5 mg via ORAL
  Filled 2015-02-03 (×2): qty 1

## 2015-02-03 MED ORDER — OXYCODONE-ACETAMINOPHEN 5-325 MG PO TABS
1.0000 | ORAL_TABLET | ORAL | Status: DC | PRN
  Administered 2015-02-04: 2 via ORAL
  Administered 2015-02-04: 1 via ORAL
  Filled 2015-02-03: qty 2
  Filled 2015-02-03 (×3): qty 1

## 2015-02-03 MED ORDER — EPHEDRINE SULFATE 50 MG/ML IJ SOLN
INTRAMUSCULAR | Status: DC | PRN
  Administered 2015-02-03: 10 mg via INTRAVENOUS

## 2015-02-03 MED ORDER — ROCURONIUM BROMIDE 100 MG/10ML IV SOLN
INTRAVENOUS | Status: DC | PRN
  Administered 2015-02-03 (×3): 10 mg via INTRAVENOUS
  Administered 2015-02-03: 40 mg via INTRAVENOUS

## 2015-02-03 MED ORDER — SCOPOLAMINE 1 MG/3DAYS TD PT72
1.0000 | MEDICATED_PATCH | TRANSDERMAL | Status: DC
  Administered 2015-02-03: 1.5 mg via TRANSDERMAL
  Filled 2015-02-03: qty 1

## 2015-02-03 MED ORDER — MIDAZOLAM HCL 5 MG/5ML IJ SOLN
INTRAMUSCULAR | Status: DC | PRN
  Administered 2015-02-03: 2 mg via INTRAVENOUS

## 2015-02-03 MED ORDER — DIPHENHYDRAMINE HCL 50 MG/ML IJ SOLN: 12.5000 mg | Freq: Four times a day (QID) | INTRAMUSCULAR | Status: DC | PRN

## 2015-02-03 MED ORDER — ACETAMINOPHEN 10 MG/ML IV SOLN
INTRAVENOUS | Status: DC | PRN
  Administered 2015-02-03: 1000 mg via INTRAVENOUS

## 2015-02-03 MED ORDER — FENTANYL CITRATE (PF) 100 MCG/2ML IJ SOLN
INTRAMUSCULAR | Status: AC
  Filled 2015-02-03: qty 6

## 2015-02-03 MED ORDER — FLUOXETINE HCL 20 MG PO CAPS
80.0000 mg | ORAL_CAPSULE | Freq: Every morning | ORAL | Status: DC
  Administered 2015-02-03 – 2015-02-05 (×2): 80 mg via ORAL
  Filled 2015-02-03 (×4): qty 4

## 2015-02-03 MED ORDER — CEFAZOLIN SODIUM-DEXTROSE 2-3 GM-% IV SOLR
2.0000 g | INTRAVENOUS | Status: AC
  Administered 2015-02-03: 2 g via INTRAVENOUS
  Filled 2015-02-03: qty 50

## 2015-02-03 MED ORDER — DIPHENHYDRAMINE HCL 12.5 MG/5ML PO ELIX: 12.5000 mg | ORAL_SOLUTION | Freq: Four times a day (QID) | ORAL | Status: DC | PRN

## 2015-02-03 MED ORDER — DOCUSATE SODIUM 100 MG PO CAPS
100.0000 mg | ORAL_CAPSULE | Freq: Two times a day (BID) | ORAL | Status: DC
  Administered 2015-02-03 – 2015-02-05 (×4): 100 mg via ORAL
  Filled 2015-02-03 (×9): qty 1

## 2015-02-03 MED ORDER — KETOROLAC TROMETHAMINE 30 MG/ML IJ SOLN
30.0000 mg | Freq: Four times a day (QID) | INTRAMUSCULAR | Status: DC
  Administered 2015-02-03 – 2015-02-05 (×7): 30 mg via INTRAVENOUS
  Filled 2015-02-03 (×11): qty 1

## 2015-02-03 MED ORDER — ONDANSETRON HCL 4 MG/2ML IJ SOLN
INTRAMUSCULAR | Status: DC | PRN
  Administered 2015-02-03: 4 mg via INTRAVENOUS

## 2015-02-03 MED ORDER — DEXTROSE IN LACTATED RINGERS 5 % IV SOLN
INTRAVENOUS | Status: DC
  Administered 2015-02-03: 14:00:00 via INTRAVENOUS
  Administered 2015-02-03: 125 mL/h via INTRAVENOUS
  Filled 2015-02-03: qty 1000

## 2015-02-03 MED ORDER — SODIUM CHLORIDE 0.9 % IJ SOLN: 9.0000 mL | INTRAMUSCULAR | Status: DC | PRN

## 2015-02-03 MED ORDER — HYDROMORPHONE HCL 1 MG/ML IJ SOLN
0.2000 mg | INTRAMUSCULAR | Status: DC | PRN
  Administered 2015-02-03 (×3): 0.6 mg via INTRAVENOUS
  Filled 2015-02-03 (×3): qty 1

## 2015-02-03 MED ORDER — SIMETHICONE 80 MG PO CHEW
80.0000 mg | CHEWABLE_TABLET | Freq: Four times a day (QID) | ORAL | Status: DC | PRN
  Filled 2015-02-03: qty 1

## 2015-02-03 MED ORDER — LACTATED RINGERS IV SOLN
INTRAVENOUS | Status: DC
  Administered 2015-02-03: 12:00:00 via INTRAVENOUS
  Filled 2015-02-03: qty 1000

## 2015-02-03 MED ORDER — BUPIVACAINE HCL (PF) 0.25 % IJ SOLN
INTRAMUSCULAR | Status: DC | PRN
  Administered 2015-02-03: 2 mL

## 2015-02-03 MED ORDER — ONDANSETRON HCL 4 MG/2ML IJ SOLN
4.0000 mg | Freq: Once | INTRAMUSCULAR | Status: DC | PRN
  Filled 2015-02-03: qty 2

## 2015-02-03 MED ORDER — DEXAMETHASONE SODIUM PHOSPHATE 4 MG/ML IJ SOLN
INTRAMUSCULAR | Status: DC | PRN
  Administered 2015-02-03: 10 mg via INTRAVENOUS

## 2015-02-03 MED ORDER — NEOSTIGMINE METHYLSULFATE 10 MG/10ML IV SOLN
INTRAVENOUS | Status: DC | PRN
  Administered 2015-02-03: 4 mg via INTRAVENOUS

## 2015-02-03 MED ORDER — HEMOSTATIC AGENTS (NO CHARGE) OPTIME
TOPICAL | Status: DC | PRN
  Administered 2015-02-03: 1 via TOPICAL

## 2015-02-03 MED ORDER — ONDANSETRON HCL 4 MG PO TABS
4.0000 mg | ORAL_TABLET | Freq: Four times a day (QID) | ORAL | Status: DC | PRN
Start: 2015-02-03 — End: 2015-02-05
  Filled 2015-02-03: qty 1

## 2015-02-03 MED ORDER — NALOXONE HCL 0.4 MG/ML IJ SOLN: 0.4000 mg | INTRAMUSCULAR | Status: DC | PRN

## 2015-02-03 MED ORDER — KETOROLAC TROMETHAMINE 30 MG/ML IJ SOLN
30.0000 mg | Freq: Four times a day (QID) | INTRAMUSCULAR | Status: DC
  Filled 2015-02-03 (×9): qty 1

## 2015-02-03 MED ORDER — PROPOFOL 10 MG/ML IV BOLUS
INTRAVENOUS | Status: DC | PRN
  Administered 2015-02-03: 200 mg via INTRAVENOUS

## 2015-02-03 MED ORDER — FENTANYL CITRATE (PF) 100 MCG/2ML IJ SOLN
25.0000 ug | INTRAMUSCULAR | Status: DC | PRN
  Filled 2015-02-03: qty 1

## 2015-02-03 MED ORDER — MIDAZOLAM HCL 2 MG/2ML IJ SOLN
INTRAMUSCULAR | Status: AC
  Filled 2015-02-03: qty 2

## 2015-02-03 MED ORDER — ONDANSETRON HCL 4 MG/2ML IJ SOLN: 4.0000 mg | Freq: Four times a day (QID) | INTRAMUSCULAR | Status: DC | PRN

## 2015-02-03 MED ORDER — HYDROMORPHONE 0.3 MG/ML IV SOLN
INTRAVENOUS | Status: DC
  Administered 2015-02-03: 0.3 mg via INTRAVENOUS
  Administered 2015-02-04: 3 mg via INTRAVENOUS
  Administered 2015-02-04: 1.5 mg via INTRAVENOUS
  Administered 2015-02-04: 06:00:00 via INTRAVENOUS
  Administered 2015-02-04: 3.6 mg via INTRAVENOUS
  Filled 2015-02-03 (×2): qty 25

## 2015-02-03 MED ORDER — POLYETHYLENE GLYCOL 3350 17 G PO PACK
17.0000 g | PACK | Freq: Every day | ORAL | Status: DC | PRN
  Filled 2015-02-03: qty 1

## 2015-02-03 MED ORDER — ALUM & MAG HYDROXIDE-SIMETH 200-200-20 MG/5ML PO SUSP
30.0000 mL | ORAL | Status: DC | PRN
  Filled 2015-02-03: qty 30

## 2015-02-03 MED ORDER — CEFAZOLIN SODIUM-DEXTROSE 2-3 GM-% IV SOLR
2.0000 g | INTRAVENOUS | Status: DC
  Filled 2015-02-03: qty 50

## 2015-02-03 MED ORDER — MENTHOL 3 MG MT LOZG
1.0000 | LOZENGE | OROMUCOSAL | Status: DC | PRN
  Filled 2015-02-03: qty 9

## 2015-02-03 MED ORDER — HYDROMORPHONE HCL 1 MG/ML IJ SOLN
INTRAMUSCULAR | Status: DC | PRN
  Administered 2015-02-03 (×2): 0.5 mg via INTRAVENOUS
  Administered 2015-02-03 (×2): .25 mg via INTRAVENOUS
  Administered 2015-02-03: 0.5 mg via INTRAVENOUS

## 2015-02-03 SURGICAL SUPPLY — 100 items
APL SKNCLS STERI-STRIP NONHPOA (GAUZE/BANDAGES/DRESSINGS) ×2
APPLICATOR COTTON TIP 6IN STRL (MISCELLANEOUS) IMPLANT
BAG SPEC RTRVL LRG 6X4 10 (ENDOMECHANICALS)
BAG URINE DRAINAGE (UROLOGICAL SUPPLIES) ×4 IMPLANT
BENZOIN TINCTURE PRP APPL 2/3 (GAUZE/BANDAGES/DRESSINGS) ×2 IMPLANT
BLADE CLIPPER SURG (BLADE) IMPLANT
BLADE HEX COATED 2.75 (ELECTRODE) ×2 IMPLANT
BLADE SURG 10 STRL SS (BLADE) ×4 IMPLANT
BLADE SURG 11 STRL SS (BLADE) ×4 IMPLANT
CABLE HIGH FREQUENCY MONO STRZ (ELECTRODE) IMPLANT
CANISTER SUCT 3000ML (MISCELLANEOUS) ×2 IMPLANT
CANISTER SUCTION 2500CC (MISCELLANEOUS) ×6 IMPLANT
CATH FOLEY 2WAY SLVR  5CC 14FR (CATHETERS) ×2
CATH FOLEY 2WAY SLVR 5CC 14FR (CATHETERS) ×2 IMPLANT
CATH ROBINSON RED A/P 16FR (CATHETERS) ×2 IMPLANT
CHLORAPREP W/TINT 26ML (MISCELLANEOUS) ×4 IMPLANT
CLOSURE WOUND 1/2 X4 (GAUZE/BANDAGES/DRESSINGS) ×1
CLOSURE WOUND 1/4X4 (GAUZE/BANDAGES/DRESSINGS)
COVER BACK TABLE 60X90IN (DRAPES) ×8 IMPLANT
COVER MAYO STAND STRL (DRAPES) ×4 IMPLANT
DECANTER SPIKE VIAL GLASS SM (MISCELLANEOUS) IMPLANT
DRAPE LG THREE QUARTER DISP (DRAPES) ×8 IMPLANT
DRAPE UNDERBUTTOCKS STRL (DRAPE) ×4 IMPLANT
DRAPE WARM FLUID 44X44 (DRAPE) ×2 IMPLANT
DRSG COVADERM PLUS 2X2 (GAUZE/BANDAGES/DRESSINGS) ×4 IMPLANT
DRSG OPSITE POSTOP 3X4 (GAUZE/BANDAGES/DRESSINGS) IMPLANT
DRSG OPSITE POSTOP 4X10 (GAUZE/BANDAGES/DRESSINGS) ×2 IMPLANT
DURAPREP 26ML APPLICATOR (WOUND CARE) ×2 IMPLANT
ELECT LIGASURE LONG (ELECTRODE) IMPLANT
ELECT REM PT RETURN 9FT ADLT (ELECTROSURGICAL)
ELECTRODE REM PT RTRN 9FT ADLT (ELECTROSURGICAL) IMPLANT
FILTER SMOKE EVAC LAPAROSHD (FILTER) ×2 IMPLANT
FORCEPS CUTTING 45CM 5MM (CUTTING FORCEPS) IMPLANT
GLOVE BIO SURGEON STRL SZ 6 (GLOVE) ×8 IMPLANT
GLOVE BIOGEL M 7.0 STRL (GLOVE) ×2 IMPLANT
GLOVE BIOGEL PI IND STRL 6 (GLOVE) ×4 IMPLANT
GLOVE BIOGEL PI IND STRL 7.0 (GLOVE) ×4 IMPLANT
GLOVE BIOGEL PI INDICATOR 6 (GLOVE) ×4
GLOVE BIOGEL PI INDICATOR 7.0 (GLOVE) ×4
GLOVE ECLIPSE 6.5 STRL STRAW (GLOVE) ×4 IMPLANT
GLOVE SURG SS PI 6.5 STRL IVOR (GLOVE) ×2 IMPLANT
GOWN STRL REUS W/ TWL LRG LVL3 (GOWN DISPOSABLE) ×6 IMPLANT
GOWN STRL REUS W/TWL LRG LVL3 (GOWN DISPOSABLE) ×16
HOLDER FOLEY CATH W/STRAP (MISCELLANEOUS) ×4 IMPLANT
LIQUID BAND (GAUZE/BANDAGES/DRESSINGS) ×2 IMPLANT
MANIFOLD NEPTUNE II (INSTRUMENTS) IMPLANT
MANIPULATOR UTERINE 4.5 ZUMI (MISCELLANEOUS) IMPLANT
NDL HYPO 25X1 1.5 SAFETY (NEEDLE) ×2 IMPLANT
NDL INSUFFLATION 14GA 120MM (NEEDLE) IMPLANT
NDL INSUFFLATION 14GA 150MM (NEEDLE) IMPLANT
NDL SPNL 22GX3.5 QUINCKE BK (NEEDLE) IMPLANT
NEEDLE HYPO 25X1 1.5 SAFETY (NEEDLE) ×4 IMPLANT
NEEDLE INSUFFLATION 120MM (ENDOMECHANICALS) ×4 IMPLANT
NEEDLE INSUFFLATION 14GA 120MM (NEEDLE) IMPLANT
NEEDLE INSUFFLATION 14GA 150MM (NEEDLE) IMPLANT
NEEDLE SPNL 22GX3.5 QUINCKE BK (NEEDLE) IMPLANT
NS IRRIG 500ML POUR BTL (IV SOLUTION) ×8 IMPLANT
PACK BASIN DAY SURGERY FS (CUSTOM PROCEDURE TRAY) ×4 IMPLANT
PAD OB MATERNITY 4.3X12.25 (PERSONAL CARE ITEMS) ×4 IMPLANT
PAD PREP 24X48 CUFFED NSTRL (MISCELLANEOUS) ×4 IMPLANT
PADDING ION DISPOSABLE (MISCELLANEOUS) ×2 IMPLANT
PENCIL BUTTON HOLSTER BLD 10FT (ELECTRODE) ×4 IMPLANT
POUCH SPECIMEN RETRIEVAL 10MM (ENDOMECHANICALS) IMPLANT
SCISSORS LAP 5X35 DISP (ENDOMECHANICALS) IMPLANT
SEALER TISSUE G2 CVD JAW 45CM (ENDOMECHANICALS) IMPLANT
SET IRRIG TUBING LAPAROSCOPIC (IRRIGATION / IRRIGATOR) IMPLANT
SHEET LAVH (DRAPES) ×4 IMPLANT
SLEEVE XCEL OPT CAN 5 100 (ENDOMECHANICALS) ×4 IMPLANT
SOLUTION ANTI FOG 6CC (MISCELLANEOUS) ×4 IMPLANT
SPONGE LAP 18X18 X RAY DECT (DISPOSABLE) ×4 IMPLANT
SPONGE LAP 4X18 X RAY DECT (DISPOSABLE) ×4 IMPLANT
SPONGE SURGIFOAM ABS GEL 100 (HEMOSTASIS) ×2 IMPLANT
STRIP CLOSURE SKIN 1/2X4 (GAUZE/BANDAGES/DRESSINGS) ×1 IMPLANT
STRIP CLOSURE SKIN 1/4X4 (GAUZE/BANDAGES/DRESSINGS) IMPLANT
SUT MNCRL 0 MO-4 VIOLET 18 CR (SUTURE) ×4 IMPLANT
SUT MNCRL AB 3-0 PS2 18 (SUTURE) ×4 IMPLANT
SUT MON AB 2-0 CT1 36 (SUTURE) ×4 IMPLANT
SUT MONOCRYL 0 MO 4 18  CR/8 (SUTURE) ×6
SUT VIC AB 0 CT1 18XCR BRD8 (SUTURE) IMPLANT
SUT VIC AB 0 CT1 8-18 (SUTURE) ×8
SUT VIC AB 3-0 SH 27 (SUTURE) ×12
SUT VIC AB 3-0 SH 27X BRD (SUTURE) IMPLANT
SUT VIC AB 4-0 KS 27 (SUTURE) ×2 IMPLANT
SUT VICRYL 0 TIES 12 18 (SUTURE) ×4 IMPLANT
SUT VICRYL 0 UR6 27IN ABS (SUTURE) ×4 IMPLANT
SYR BULB IRRIGATION 50ML (SYRINGE) ×4 IMPLANT
SYR CONTROL 10ML LL (SYRINGE) IMPLANT
SYRINGE 10CC LL (SYRINGE) ×12 IMPLANT
TOWEL OR 17X24 6PK STRL BLUE (TOWEL DISPOSABLE) ×8 IMPLANT
TOWEL OR 17X26 4PK STRL BLUE (TOWEL DISPOSABLE) ×6 IMPLANT
TRAY DSU PREP LF (CUSTOM PROCEDURE TRAY) ×4 IMPLANT
TRAY FOLEY CATH SILVER 14FR (SET/KITS/TRAYS/PACK) ×2 IMPLANT
TROCAR XCEL NON-BLD 11X100MML (ENDOMECHANICALS) ×4 IMPLANT
TROCAR XCEL NON-BLD 5MMX100MML (ENDOMECHANICALS) ×4 IMPLANT
TUBE CONNECTING 12'X1/4 (SUCTIONS) ×2
TUBE CONNECTING 12X1/4 (SUCTIONS) ×6 IMPLANT
TUBING INSUFFLATION 10FT LAP (TUBING) ×4 IMPLANT
WARMER LAPAROSCOPE (MISCELLANEOUS) ×4 IMPLANT
WATER STERILE IRR 500ML POUR (IV SOLUTION) ×4 IMPLANT
YANKAUER SUCT BULB TIP NO VENT (SUCTIONS) ×4 IMPLANT

## 2015-02-03 NOTE — Addendum Note (Signed)
Addendum  created 02/03/15 1028 by Karie SchwalbeMary Daryus Sowash, MD   Modules edited: Orders

## 2015-02-03 NOTE — Anesthesia Preprocedure Evaluation (Addendum)
Anesthesia Evaluation  Patient identified by MRN, date of birth, ID band Patient awake    Reviewed: Allergy & Precautions, NPO status , Patient's Chart, lab work & pertinent test results  History of Anesthesia Complications (+) PONV and history of anesthetic complications  Airway Mallampati: II  TM Distance: >3 FB Neck ROM: Full    Dental no notable dental hx. (+) Dental Advisory Given, Teeth Intact   Pulmonary neg pulmonary ROS,  breath sounds clear to auscultation  Pulmonary exam normal       Cardiovascular Exercise Tolerance: Good negative cardio ROS Normal cardiovascular examRhythm:Regular Rate:Normal  Zumba instructor   Neuro/Psych  Headaches, PSYCHIATRIC DISORDERS Anxiety    GI/Hepatic negative GI ROS, Neg liver ROS,   Endo/Other  Hypothyroidism   Renal/GU negative Renal ROS  negative genitourinary   Musculoskeletal negative musculoskeletal ROS (+)   Abdominal   Peds negative pediatric ROS (+)  Hematology negative hematology ROS (+)   Anesthesia Other Findings   Reproductive/Obstetrics negative OB ROS                           Anesthesia Physical Anesthesia Plan  ASA: II  Anesthesia Plan: General   Post-op Pain Management:    Induction: Intravenous  Airway Management Planned: Oral ETT  Additional Equipment:   Intra-op Plan:   Post-operative Plan: Extubation in OR  Informed Consent: I have reviewed the patients History and Physical, chart, labs and discussed the procedure including the risks, benefits and alternatives for the proposed anesthesia with the patient or authorized representative who has indicated his/her understanding and acceptance.   Dental advisory given  Plan Discussed with: CRNA  Anesthesia Plan Comments:         Anesthesia Quick Evaluation

## 2015-02-03 NOTE — Transfer of Care (Signed)
Immediate Anesthesia Transfer of Care Note  Patient: Alison Gutierrez  Procedure(s) Performed: Procedure(s):  ATTEMPTED LAPAROSCOPIC ASSISTED VAGINAL HYSTERECTOMY (N/A) SALPINGO OOPHORECTOMY (Left) HYSTERECTOMY ABDOMINAL (N/A)  Patient Location: PACU  Anesthesia Type:General  Level of Consciousness: awake, alert , oriented and patient cooperative  Airway & Oxygen Therapy: Patient Spontanous Breathing and Patient connected to nasal cannula oxygen  Post-op Assessment: Report given to RN and Post -op Vital signs reviewed and stable  Post vital signs: Reviewed and stable  Last Vitals:  Filed Vitals:   02/03/15 0932  BP:   Pulse: 60  Temp: 36.4 C  Resp: 10    Complications: No apparent anesthesia complications

## 2015-02-03 NOTE — Op Note (Signed)
PROCEDURE DATE: 02/03/2015  PREOPERATIVE DIAGNOSIS: Heavy and irregular vaginal bleeding, dysmenorrhea  POSTOPERATIVE DIAGNOSIS: The same  PROCEDURE: Diagnostic laparoscopy, Total abdominal hysterectomy, bilateral salpingectomy, left oophorectomy, back-fill of bladder SURGEON: Dr. Mitchel Honour  ASSISTANT: Dr. Richarda Overlie INDICATIONS: 39 y.o. G2P2 with heavy, irregular vaginal bleeding, dysmenorrhea desiring definitive surgical management. Risks of surgery were discussed with the patient including but not limited to: bleeding which may require transfusion or reoperation; infection which may require antibiotics; injury to bowel, bladder, ureters or other surrounding organs; need for additional procedures including laparotomy; thromboembolic phenomenon, incisional problems and other postoperative/anesthesia complications. Written informed consent was obtained.  FINDINGS: Small uterus, densely adherent to anterior abdominal wall and bladder up to fundus rendering the uterus immobile to manipulation.  Left adnexa with filmy adhesions and colon closely adherent.  Normal ovaries and fallopian tubes bilaterally with Filshie clips in place.  Filmy adhesions in cul-de-sac.       ANESTHESIA: General  ESTIMATED BLOOD LOSS: 150 ml  SPECIMENS: Uterus, cervix, bilateral fallopian tubes, left ovary COMPLICATIONS: None immediate  PROCEDURE IN DETAIL: The patient received intravenous antibiotics and had sequential compression devices applied to her lower extremities while in the preoperative area. She was then taken to the operating room where general anesthesia was administered and was found to be adequate. She was placed in the dorsal lithotomy position, and was prepped and draped in a sterile manner. An in and out catheterization was performed. A uterine manipulator was then advanced into the uterus . After an adequate timeout was performed, attention was then turned to the patient's abdomen where a 10-mm skin  incision was made in the umbilical fold. The Veress needle was carefully introduced into the peritoneal cavity through the abdominal wall. Intraperitoneal placement was confirmed by drop in intraabdominal pressure with insufflation of carbon dioxide gas. Adequate pneumoperitoneum was obtained, and the 10/11 XL trocar and sleeve were then advanced without difficulty into the abdomen where intraabdominal placement was confirmed by the laparoscope. A survey of the patient's pelvis and abdomen revealed the above listed findings.  Secondary to the densely adherent uterus to the anterior abdominal wall, manipulation was not possible.  Also, the colon was adherent to the left adnexa.  The decision was made to convert to laparotomy.  Instruments were removed.  The fascial incision was closed using 2-0 Vicryl in a figure of eight stitch.  Skin incision was closed with 4-0 Monocryl in a subcuticular fashion.   A Foley catheter was inserted into the bladder and attached to constant drainage.  A Pfannensteil skin incision was made. This incision was taken down to the fascia using electrocautery with care given to maintain good hemostasis. The fascia was incised in the midline and the fascial incision was then extended bilaterally using electrocautery without difficulty. The fascia was then dissected off the underlying rectus muscles using blunt and sharp dissection. The rectus muscles were split bluntly in the midline and the peritoneum entered sharply without complication. This peritoneal incision was then extended superiorly and inferiorly with care given to prevent bowel or bladder injury. Upon entry into the abdominal cavity, the upper abdomen was inspected and found to be normal. Attention was then turned to the pelvis. In order to allow room for bladder blade placement, sharp dissection was performed to free the uterus from the anterior abdominal wall.  A retractor was placed into the incision, and the bowel was packed  away with moist laparotomy sponges. Additional sharp dissection was performed to free bladder adhesions from  the uterus starting at the uterine fundus.  The uterus at this point was noted to be mobilized. The round ligaments on each side were clamped, suture ligated, and transected with electrocautery allowing entry into the broad ligament. The anterior and posterior leaves of the broad ligament were separated and the ureters were inspected to be safely away from the area of dissection bilaterally. A hole was created in the clear portion of the posterior broad ligament. The right uteroovarian was clamped, cut, and doubly suture ligated with good hemostasis. The fallopian tube was then removed after clamping, cutting and suture ligating with 2-0 Vicryl.  The Filshie clip fell off the specimen and was not sent to pathology.  Attention was turned to the left adnexa.  This region had adhesion of the colon nearby.  These were dissection to allow access to the adnexa.  A hole was created in the clear portion of the posterior broad ligament and the infundibulopelvic ligament clamped on the patient's right side. This pedicle was then clamped, cut, and doubly suture ligated with good hemostasis.  A bladder flap was then created across the anterior leaf of the broad ligament and the bladder was then bluntly dissected off the lower uterine segment and cervix with good hemostasis. The uterine arteries were then skeletonized bilaterally and then clamped, cut, and ligated with care given to prevent ureteral injury. The uterosacral ligaments were then clamped, cut, and ligated bilaterally. Finally, the cardinal ligaments were clamped, cut, and ligated bilaterally.  Acutely curved clamps were placed across the vagina just under the cervix, and the specimen was amputated and sent to pathology. The vaginal cuff was closed with a series of interrupted 0 Vicryl figure-of-eight sutures with care given to incorporate the anterior  pubocervical fascia and the posterior rectovaginal fascia.  The vaginal cuff angles were noted to have good hemostasis and were tied together for support.  The peritoneal edge was oozing near the right ovary.  3-0 Vicryl was used in a running fashion to reapproximate the edges to hemostasis.  The pelvis was irrigated and hemostasis was reconfirmed at all pedicles and along the pelvic sidewall.  The bladder was back-filled using methylene blue and competence was noted.  Gelfoam was placed in the pelvis to ensure the denuded bladder flap edges remained hemostatic.  All laparotomy sponges and instruments were removed from the abdomen. The peritoneum was closed with 2-0 Monocryl, and the fascia was closed with 0 Vicryl in a running fashion. The skin was closed with a 4-0 Monocryl subcuticular stitch. Sponge, lap, needle, and instrument counts were correct times two. The patient was taken to the recovery area awake, extubated and in stable condition.

## 2015-02-03 NOTE — Anesthesia Postprocedure Evaluation (Signed)
  Anesthesia Post-op Note  Patient: Alison Gutierrez  Procedure(s) Performed: Procedure(s) (LRB):  ATTEMPTED LAPAROSCOPIC ASSISTED VAGINAL HYSTERECTOMY (N/A)  LEFT SALPINGO OOPHORECTOMY AND RIGHT FALLOPIAN TUBE REMOVAL (Bilateral) HYSTERECTOMY ABDOMINAL (N/A)  Patient Location: PACU  Anesthesia Type: General  Level of Consciousness: awake and alert   Airway and Oxygen Therapy: Patient Spontanous Breathing  Post-op Pain: mild  Post-op Assessment: Post-op Vital signs reviewed, Patient's Cardiovascular Status Stable, Respiratory Function Stable, Patent Airway and No signs of Nausea or vomiting  Last Vitals:  Filed Vitals:   02/03/15 1000  BP: 134/72  Pulse: 63  Temp:   Resp: 20    Post-op Vital Signs: stable   Complications: No apparent anesthesia complications

## 2015-02-03 NOTE — Progress Notes (Signed)
Complains of poor pain control.  No N/V.  No CP/SOB.  Pain is abdominal and incisional.  Tolerating clears.  VSS. AF. UOP adequate and clear  Gen: A&O x 3 Abd: soft, ND, approp TTP.  Honeycomb dressing 30% stained Ext: no c/c/e, SCDs  POD0 s/p TAH, LSO -Will add dilaudid PCA -K pad to abdomen/back -Replace dressing -AM labs pending  Mitchel HonourMegan Ksean Vale, DO

## 2015-02-03 NOTE — Anesthesia Procedure Notes (Signed)
Procedure Name: Intubation Date/Time: 02/03/2015 7:33 AM Performed by: Tyrone NineSAUVE, Timberlyn Pickford F Pre-anesthesia Checklist: Timeout performed, Patient identified, Emergency Drugs available, Suction available and Patient being monitored Patient Re-evaluated:Patient Re-evaluated prior to inductionOxygen Delivery Method: Circle system utilized Preoxygenation: Pre-oxygenation with 100% oxygen Intubation Type: IV induction Ventilation: Mask ventilation without difficulty Grade View: Grade I Tube type: Oral Tube size: 7.0 mm Number of attempts: 1 Airway Equipment and Method: Stylet and Bite block Placement Confirmation: ETT inserted through vocal cords under direct vision,  positive ETCO2 and breath sounds checked- equal and bilateral Secured at: 21 cm Tube secured with: Tape Dental Injury: Teeth and Oropharynx as per pre-operative assessment

## 2015-02-03 NOTE — Progress Notes (Signed)
No change to H&P. 

## 2015-02-04 ENCOUNTER — Encounter (HOSPITAL_BASED_OUTPATIENT_CLINIC_OR_DEPARTMENT_OTHER): Payer: Self-pay | Admitting: Obstetrics & Gynecology

## 2015-02-04 LAB — CBC
HCT: 34.8 % — ABNORMAL LOW (ref 36.0–46.0)
Hemoglobin: 11.8 g/dL — ABNORMAL LOW (ref 12.0–15.0)
MCH: 31.7 pg (ref 26.0–34.0)
MCHC: 33.9 g/dL (ref 30.0–36.0)
MCV: 93.5 fL (ref 78.0–100.0)
PLATELETS: 220 10*3/uL (ref 150–400)
RBC: 3.72 MIL/uL — AB (ref 3.87–5.11)
RDW: 13 % (ref 11.5–15.5)
WBC: 9.5 10*3/uL (ref 4.0–10.5)

## 2015-02-04 LAB — COMPREHENSIVE METABOLIC PANEL
ALT: 12 U/L — ABNORMAL LOW (ref 14–54)
AST: 22 U/L (ref 15–41)
Albumin: 3.1 g/dL — ABNORMAL LOW (ref 3.5–5.0)
Alkaline Phosphatase: 47 U/L (ref 38–126)
Anion gap: 6 (ref 5–15)
BUN: 10 mg/dL (ref 6–20)
CALCIUM: 8.1 mg/dL — AB (ref 8.9–10.3)
CO2: 29 mmol/L (ref 22–32)
CREATININE: 0.62 mg/dL (ref 0.44–1.00)
Chloride: 101 mmol/L (ref 101–111)
GFR calc Af Amer: >60 mL/min (ref >60)
GFR calc non Af Amer: >60 mL/min (ref >60)
Glucose, Bld: 120 mg/dL — ABNORMAL HIGH (ref 65–99)
Potassium: 4.4 mmol/L (ref 3.5–5.1)
SODIUM: 136 mmol/L (ref 135–145)
Total Bilirubin: 0.8 mg/dL (ref 0.3–1.2)
Total Protein: 6.1 g/dL — ABNORMAL LOW (ref 6.5–8.1)

## 2015-02-04 MED ORDER — LORAZEPAM 0.5 MG PO TABS
0.5000 mg | ORAL_TABLET | Freq: Once | ORAL | Status: AC
  Administered 2015-02-04: 0.5 mg via ORAL
  Filled 2015-02-04: qty 1

## 2015-02-04 MED ORDER — LORAZEPAM 2 MG/ML IJ SOLN: 0.5000 mg | Freq: Once | INTRAMUSCULAR | Status: AC

## 2015-02-04 MED ORDER — OXYCODONE-ACETAMINOPHEN 7.5-325 MG PO TABS
1.0000 | ORAL_TABLET | ORAL | Status: DC | PRN
  Administered 2015-02-04 – 2015-02-05 (×5): 1 via ORAL
  Filled 2015-02-04 (×5): qty 1

## 2015-02-04 NOTE — Progress Notes (Signed)
Better pain control.  Episode of hyperventilation last night.  Rapid response called.  EKG wnl.  Resolved.  No N/V.  Tolerating clears.    VSS. AF.   UOP clear and adequate Labs reviewed and wnl  Gen: A&O x 3 Abd: soft, ND.  Inc c/d/i with no change in soiled area.  Dressing was not changed last night.  I changed dressing at this time. Ext: no c/c/e  POD#1 s/p TAH, LSO -Advance diet -Plan to transition to po meds after lunch -D/C Foley and IVF  Mitchel HonourMegan Claudell Wohler, DO

## 2015-02-04 NOTE — Progress Notes (Signed)
0200.  Episode of  Hyperventilation  Patient wake up with dizziness and  Feeling like she was going to faint.   Because she kept saying I feel really dizzy and I feel strange.  I called rapid responds nurse.  I stayed with her the whole time and tried to calm her down.  Vitals taken several time through this episode.  Rapid response nurse came and assessed her.  More vitals taken, She was running 103 systolic and 50's Diastolic.  Will call  MD to report this and see about   This and other recommendation. At present pt is calm, no tachypnea noted and she's feeling better and denies pain also.  [name].  RN.

## 2015-02-05 MED ORDER — NITROFURANTOIN MONOHYD MACRO 100 MG PO CAPS: 100.0000 mg | ORAL_CAPSULE | Freq: Every day | ORAL | Status: DC

## 2015-02-05 MED ORDER — IBUPROFEN 800 MG PO TABS
800.0000 mg | ORAL_TABLET | Freq: Three times a day (TID) | ORAL | Status: DC | PRN
Start: 2015-02-05 — End: 2020-01-09

## 2015-02-05 MED ORDER — DOCUSATE SODIUM 100 MG PO CAPS: 100.0000 mg | ORAL_CAPSULE | Freq: Two times a day (BID) | ORAL | Status: DC

## 2015-02-05 NOTE — Progress Notes (Signed)
Patient and husband educated on how to changed large foley bag to leg bag.  Stated understanding, written discharge information provided

## 2015-02-05 NOTE — Progress Notes (Addendum)
No new complaints.  Better pain control with po Percocet.  Ambulating well.  Foley replaced secondary to urinary retention which occurs postop for patient historically.  No N/V.  Passing flatus.  VSS. AF.  Gen: A&O x 3 Abd: soft, ND, inc c/d/i Ext: no c/c/e  POD#2 s/p TAH, LSO -D/C home with f/u in office in 1 week. -Leave foley in with leg bag

## 2015-02-05 NOTE — Progress Notes (Signed)
Discharge instructions given to patient and husband along with prescriptions.  Questions answered 

## 2015-02-05 NOTE — Discharge Summary (Signed)
Physician Discharge Summary  Patient ID: Alison Gutierrez MRN: [id number] DOB/AGE: [birth year] 39 y.o.  Admit date: 02/03/2015 Discharge date: 02/05/2015  Admission Diagnoses: 39yo with heavy, irregular vaginal bleeding, dysmenorrhea, pelvic pain  Discharge Diagnoses: SAA Active Problems:   S/P hysterectomy with oophorectomy   Discharged Condition: good  Hospital Course: Patient was admitted for anticipated procedure: LAVH, LSO.  Laparoscopic intraoperative findings including dense adhesions of the uterus to the anterior abdominal wall and bladder and the left adnexa to bowel.  The decision was made to convert to TAH, LSO.  This was performed without complications.  The bladder was back-filled to ensure no injury had occurred.  The patient did well postoperatively with exception of urinary retention after Foley removal; the patient has history of the same in all other surgical procedures.  On POD#2, the patient was discharged home with a leg bag and close follow up in the office.  Consults: None  Significant Diagnostic Studies: none  Treatments: surgery: Diagnostic laparoscopy, TAH, LSO, right salpingectomy  Discharge Exam: Blood pressure 107/64, pulse 50, temperature 99 F (37.2 C), temperature source Oral, resp. rate 18, height  (1.676 m), weight 170 lb (77.111 kg), last menstrual period 01/30/2015, SpO2 95 %. General appearance: alert, cooperative and appears stated age GI: appropriate ttp, inc c/d/i Extremities: extremities normal, atraumatic, no cyanosis or edema  Disposition: 01-Home or Self Care  Discharge Instructions    Discharge patient    Complete by:  As directed      Leg bag during the day    Complete by:  As directed             Medication List    TAKE these medications        docusate sodium 100 MG capsule  Commonly known as:  COLACE  Take 1 capsule (100 mg total) by mouth 2 (two) times daily.     FLUoxetine 40 MG capsule  Commonly known as:  PROZAC   Take 80 mg by mouth every morning.     ibuprofen 800 MG tablet  Commonly known as:  ADVIL  Take 1 tablet (800 mg total) by mouth every 8 (eight) hours as needed.     levothyroxine 125 MCG tablet  Commonly known as:  SYNTHROID, LEVOTHROID  Take 125 mcg by mouth daily before breakfast.     nitrofurantoin (macrocrystal-monohydrate) 100 MG capsule  Commonly known as:  MACROBID  Take 1 capsule (100 mg total) by mouth at bedtime.     zolmitriptan 5 MG tablet  Commonly known as:  ZOMIG  Take 5 mg by mouth as needed for migraine.     zolpidem 5 MG tablet  Commonly known as:  AMBIEN  Take 5 mg by mouth at bedtime as needed for sleep.         SignedMitchel Honour 02/05/2015, 8:44 PM

## 2015-04-24 ENCOUNTER — Other Ambulatory Visit: Payer: Self-pay | Admitting: Endocrinology

## 2015-04-24 DIAGNOSIS — E042 Nontoxic multinodular goiter: Secondary | ICD-10-CM

## 2015-05-06 ENCOUNTER — Other Ambulatory Visit: Payer: Commercial Managed Care - PPO

## 2015-05-13 ENCOUNTER — Other Ambulatory Visit: Payer: Commercial Managed Care - PPO

## 2016-02-08 ENCOUNTER — Other Ambulatory Visit: Payer: Self-pay | Admitting: Obstetrics and Gynecology

## 2016-02-08 DIAGNOSIS — N644 Mastodynia: Secondary | ICD-10-CM

## 2016-02-12 ENCOUNTER — Ambulatory Visit
Admission: RE | Admit: 2016-02-12 | Discharge: 2016-02-12 | Disposition: A | Discharge status: Home / Self Care | Payer: BC Managed Care – PPO | Source: Ambulatory Visit | Attending: Obstetrics and Gynecology | Admitting: Obstetrics and Gynecology

## 2016-02-12 DIAGNOSIS — N644 Mastodynia: Secondary | ICD-10-CM

## 2016-05-26 ENCOUNTER — Other Ambulatory Visit: Payer: Self-pay | Admitting: Endocrinology

## 2016-05-26 ENCOUNTER — Other Ambulatory Visit: Payer: Self-pay | Admitting: Internal Medicine

## 2016-05-26 DIAGNOSIS — E041 Nontoxic single thyroid nodule: Secondary | ICD-10-CM

## 2018-04-16 ENCOUNTER — Other Ambulatory Visit: Payer: Self-pay | Admitting: Endocrinology

## 2018-04-16 DIAGNOSIS — E041 Nontoxic single thyroid nodule: Secondary | ICD-10-CM

## 2018-08-29 ENCOUNTER — Other Ambulatory Visit: Payer: Self-pay | Admitting: Endocrinology

## 2018-08-29 DIAGNOSIS — E041 Nontoxic single thyroid nodule: Secondary | ICD-10-CM

## 2018-09-11 ENCOUNTER — Other Ambulatory Visit: Payer: BC Managed Care – PPO

## 2019-01-14 ENCOUNTER — Ambulatory Visit
Admission: RE | Admit: 2019-01-14 | Discharge: 2019-01-14 | Disposition: A | Discharge status: Home / Self Care | Payer: Self-pay | Source: Ambulatory Visit | Attending: Endocrinology | Admitting: Endocrinology

## 2019-01-14 DIAGNOSIS — E041 Nontoxic single thyroid nodule: Secondary | ICD-10-CM

## 2019-05-30 ENCOUNTER — Other Ambulatory Visit: Payer: Self-pay | Admitting: Physician Assistant

## 2019-05-30 ENCOUNTER — Ambulatory Visit
Admission: RE | Admit: 2019-05-30 | Discharge: 2019-05-30 | Disposition: A | Discharge status: Home / Self Care | Payer: No Typology Code available for payment source | Source: Ambulatory Visit | Attending: Physician Assistant | Admitting: Physician Assistant

## 2019-05-30 DIAGNOSIS — U071 COVID-19: Secondary | ICD-10-CM

## 2020-01-09 ENCOUNTER — Ambulatory Visit (INDEPENDENT_AMBULATORY_CARE_PROVIDER_SITE_OTHER): Payer: No Typology Code available for payment source

## 2020-01-09 ENCOUNTER — Ambulatory Visit (INDEPENDENT_AMBULATORY_CARE_PROVIDER_SITE_OTHER): Payer: No Typology Code available for payment source | Admitting: Emergency Medicine

## 2020-01-09 ENCOUNTER — Encounter: Payer: Self-pay | Admitting: Emergency Medicine

## 2020-01-09 ENCOUNTER — Other Ambulatory Visit: Payer: Self-pay

## 2020-01-09 VITALS — BP 120/70 | HR 63 | Ht 66.0 in | Wt 183.8 lb

## 2020-01-09 DIAGNOSIS — R0789 Other chest pain: Secondary | ICD-10-CM | POA: Diagnosis not present

## 2020-01-09 MED ORDER — OMEPRAZOLE 20 MG PO CPDR: 20.0000 mg | DELAYED_RELEASE_CAPSULE | Freq: Every day | ORAL | 0 refills | Status: DC

## 2020-01-09 NOTE — Progress Notes (Signed)
Subjective:    Patient ID: Alison Gutierrez, female    DOB: [birth year], 44 y.o.   MRN: [id number]  HPI 44 year old woman, never smoker with history of migraines, Hashimoto's thyroiditis now on levothyroxine, OCD. She may have had exercise-related asthma as a teen, seemed to grow out of it.   She is referred today for evaluation of chest burning that has been present since she had COVID in 04/2019. At that time she had most of the typical sx. She was not admitted but was treated with albuterol. No steroids or anti-virals. She feels SOB, but is able to push through it. Some chest tightness. No wheeze, some cough non-productive. No overt GERD sx, unchanged by food. Albuterol has not really helped any. Occasionally feeling some palpitations.    CXR 05/30/2019 reviewed by me, no infiltrates.    Review of Systems As per HPI  Past Medical History:  Diagnosis Date  . Anxiety   . Complication of anesthesia    POST-OP  URINARY RETENTION  . Hashimoto's disease   . Heavy menstrual bleeding   . History of urinary retention    POST-OP EVERY SURGERY W/ ANESTHESIA  AND C/S  . Insomnia   . Migraine   . Pelvic pain in female   . PONV (postoperative nausea and vomiting)      Family History  Problem Relation Age of Onset  . Epilepsy Mother   . Stroke Maternal Grandmother      Social History   Socioeconomic History  . Marital status: Married    Spouse name: Not on file  . Number of children: Not on file  . Years of education: Not on file  . Highest education level: Not on file  Occupational History  . Not on file  Tobacco Use  . Smoking status: Never Smoker  . Smokeless tobacco: Never Used  Substance and Sexual Activity  . Alcohol use: Yes    Comment: rare  . Drug use: No  . Sexual activity: Not on file  Other Topics Concern  . Not on file  Social History Narrative  . Not on file   Social Determinants of Health   Financial Resource Strain:   . Difficulty of Paying Living  Expenses:   Food Insecurity:   . Worried About Charity fundraiser in the Last Year:   . Arboriculturist in the Last Year:   Transportation Needs:   . Film/video editor (Medical):   Marland Kitchen Lack of Transportation (Non-Medical):   Physical Activity:   . Days of Exercise per Week:   . Minutes of Exercise per Session:   Stress:   . Feeling of Stress :   Social Connections:   . Frequency of Communication with Friends and Family:   . Frequency of Social Gatherings with Friends and Family:   . Attends Religious Services:   . Active Member of Clubs or Organizations:   . Attends Archivist Meetings:   Marland Kitchen Marital Status:   Intimate Partner Violence:   . Fear of Current or Ex-Partner:   . Emotionally Abused:   Marland Kitchen Physically Abused:   . Sexually Abused:    From [place], also [place].  Works for [employer], Corporate investment banker No other inhaled exposures.     Allergies  Allergen Reactions  . Lidocaine Shortness Of Breath and Other (See Comments)    bp went down and she needed epinephrine     Outpatient Medications Prior to Visit  Medication Sig Dispense Refill  .  albuterol (VENTOLIN HFA) 108 (90 Base) MCG/ACT inhaler Inhale into the lungs.    Marland Kitchen FLUoxetine (PROZAC) 40 MG capsule Take 80 mg by mouth every morning.     Marland Kitchen levothyroxine (SYNTHROID, LEVOTHROID) 125 MCG tablet Take 125 mcg by mouth daily before breakfast.    . zolpidem (AMBIEN) 5 MG tablet Take 5 mg by mouth at bedtime as needed for sleep.    Marland Kitchen docusate sodium (COLACE) 100 MG capsule Take 1 capsule (100 mg total) by mouth 2 (two) times daily. 10 capsule 0  . ibuprofen (ADVIL,MOTRIN) 800 MG tablet Take 1 tablet (800 mg total) by mouth every 8 (eight) hours as needed. 30 tablet 1  . nitrofurantoin, macrocrystal-monohydrate, (MACROBID) 100 MG capsule Take 1 capsule (100 mg total) by mouth at bedtime. 7 capsule 0  . zolmitriptan (ZOMIG) 5 MG tablet Take 5 mg by mouth as needed for migraine.     No facility-administered medications prior  to visit.        Objective:   Physical Exam Vitals:   01/09/20 1447  BP: 120/70  Pulse: 63  SpO2: 97%  Weight: 183 lb 12.8 oz (83.4 kg)  Height: 5\' 6"  (1.676 m)   Gen: Pleasant, well-nourished, in no distress,  normal affect  ENT: No lesions,  mouth clear,  oropharynx clear, no postnasal drip  Neck: No JVD, no stridor  Lungs: No use of accessory muscles, no crackles or wheezing on normal respiration, no wheeze on forced expiration  Cardiovascular: RRR, heart sounds normal, no murmur or gallops, no peripheral edema  Musculoskeletal: No deformities, no cyanosis or clubbing  Neuro: alert, awake, non focal  Skin: Warm, no lesions or rash     Assessment & Plan:  Burning chest pain Burning chest discomfort that can happen at rest but is worse with exertion, present ever since she had COVID-19 pneumonitis.  Her chest x-ray is clear but the symptoms have persisted.  Sometimes associated with dyspnea, question a component of bronchospasm.  She does have a history of exercise related asthma in the past.  She never received steroids.  There are reports of persistent dyspnea and chest discomfort months following COVID-19 infection, suspect that her current symptoms are directly related.  We need to rule out airflow obstruction.  I will also repeat her chest x-ray to compare with prior.  No clear evidence for GERD but it is reasonable to do an empiric trial of PPI to see if she gets benefit.  We will try omeprazole for 10 days.  We will perform a chest x-ray today to compare with your priors. We will arrange for full pulmonary function testing at your next office visit Try taking omeprazole 20 mg once daily for 10 days to see if this helps any with chest discomfort.  Take this 1 hour around food. Follow with Dr. next available with full pulmonary function testing on the same day.  Delton Coombes, MD, PhD 01/09/2020, 3:18 PM [facility] [phone] or if no  answer [phone]

## 2020-01-09 NOTE — Patient Instructions (Signed)
We will perform a chest x-ray today to compare with your priors. We will arrange for full pulmonary function testing at your next office visit Try taking omeprazole 20 mg once daily for 10 days to see if this helps any with chest discomfort.  Take this 1 hour around food. Follow with Dr. Delton Coombes next available with full pulmonary function testing on the same day.

## 2020-01-09 NOTE — Assessment & Plan Note (Signed)
Burning chest discomfort that can happen at rest but is worse with exertion, present ever since she had COVID-19 pneumonitis.  Her chest x-ray is clear but the symptoms have persisted.  Sometimes associated with dyspnea, question a component of bronchospasm.  She does have a history of exercise related asthma in the past.  She never received steroids.  There are reports of persistent dyspnea and chest discomfort months following COVID-19 infection, suspect that her current symptoms are directly related.  We need to rule out airflow obstruction.  I will also repeat her chest x-ray to compare with prior.  No clear evidence for GERD but it is reasonable to do an empiric trial of PPI to see if she gets benefit.  We will try omeprazole for 10 days.  We will perform a chest x-ray today to compare with your priors. We will arrange for full pulmonary function testing at your next office visit Try taking omeprazole 20 mg once daily for 10 days to see if this helps any with chest discomfort.  Take this 1 hour around food. Follow with Dr. Delton Coombes next available with full pulmonary function testing on the same day.

## 2020-01-17 ENCOUNTER — Other Ambulatory Visit: Payer: Self-pay | Admitting: Emergency Medicine

## 2020-02-25 ENCOUNTER — Ambulatory Visit: Payer: No Typology Code available for payment source | Admitting: Emergency Medicine

## 2020-04-19 IMAGING — DX DG CHEST 2V
2 series · 2 of 2 positions shown · non-contrast
Comparison: None.

CLINICAL DATA: Shortness of breath.  [id number] virus infection

EXAM:
CHEST - 2 VIEW

[dg chest 2 view (1 of 2)]
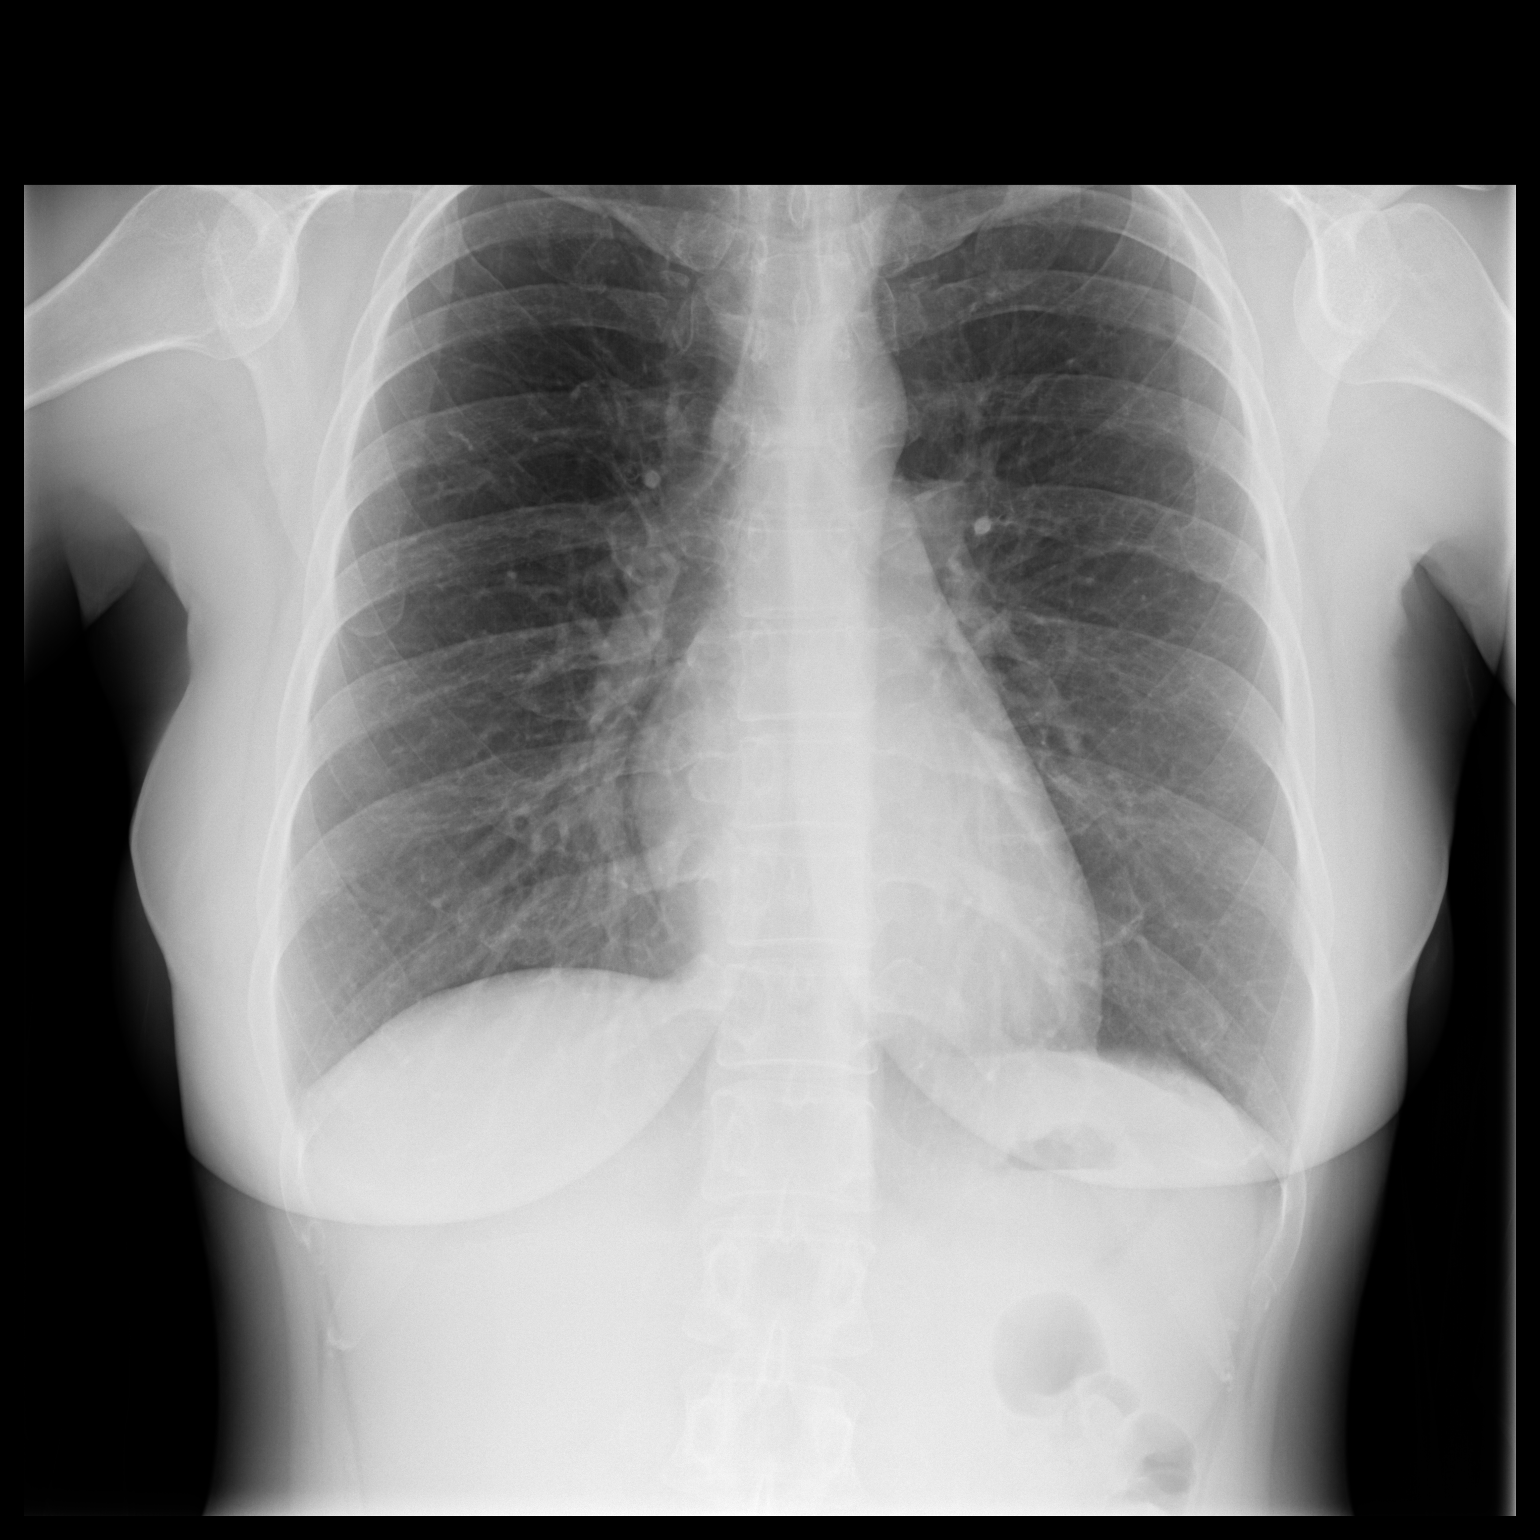

[dg chest 2 view (2 of 2)]
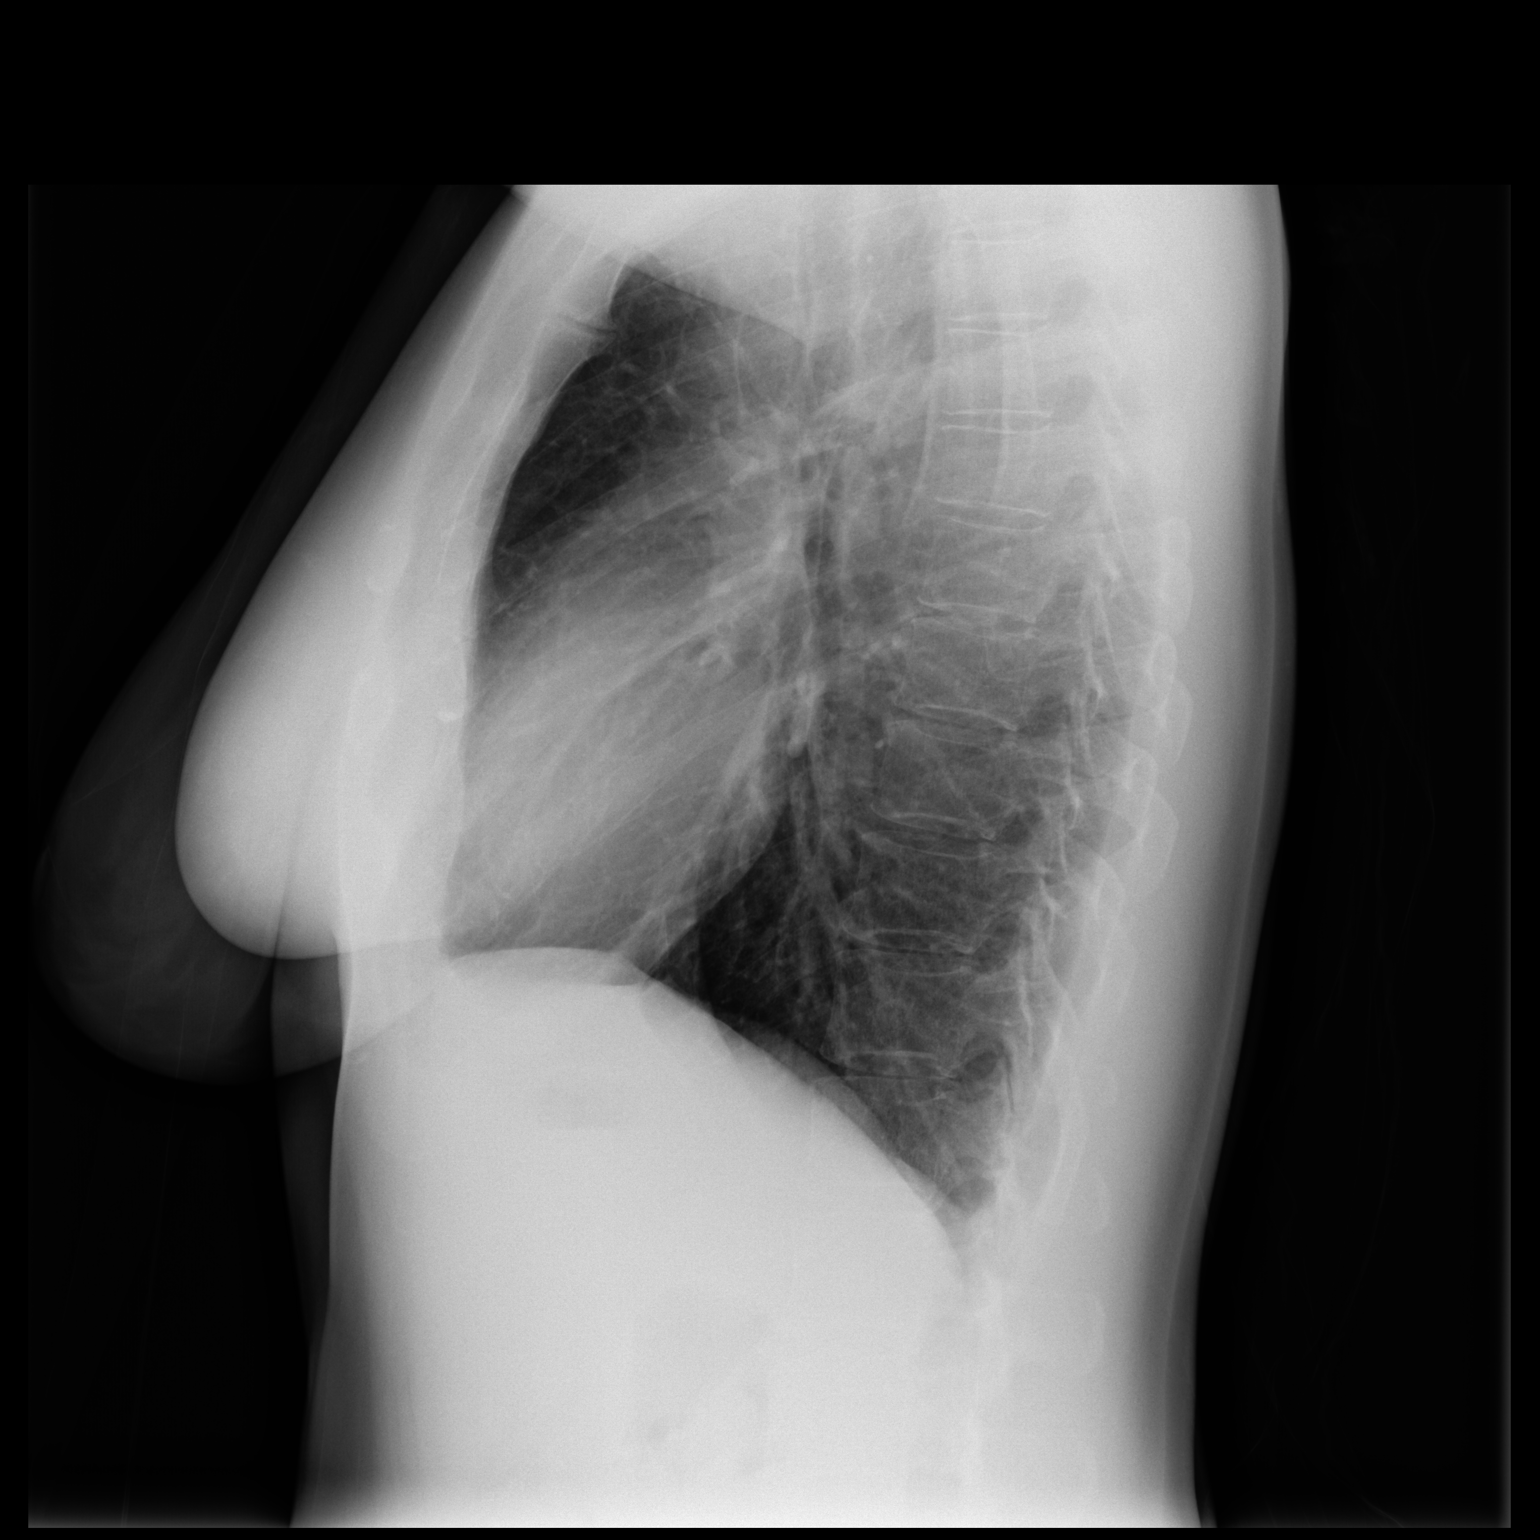

[2 of 2 positions shown; findings below may reference images not displayed]

FINDINGS: The heart size and mediastinal contours are within normal limits.
Both lungs are clear. The visualized skeletal structures are
unremarkable.
IMPRESSION: Negative.  No active cardiopulmonary disease.

## 2020-05-13 ENCOUNTER — Other Ambulatory Visit: Payer: Self-pay | Admitting: Emergency Medicine

## 2020-11-29 IMAGING — DX DG CHEST 2V
2 series · 2 of 2 positions shown · non-contrast
Comparison: 05/30/2019

CLINICAL DATA: Chest discomfort. COVID in [REDACTED]. Residual chest
burning with discomfort and tightness.

EXAM:
CHEST - 2 VIEW

[chest pa]
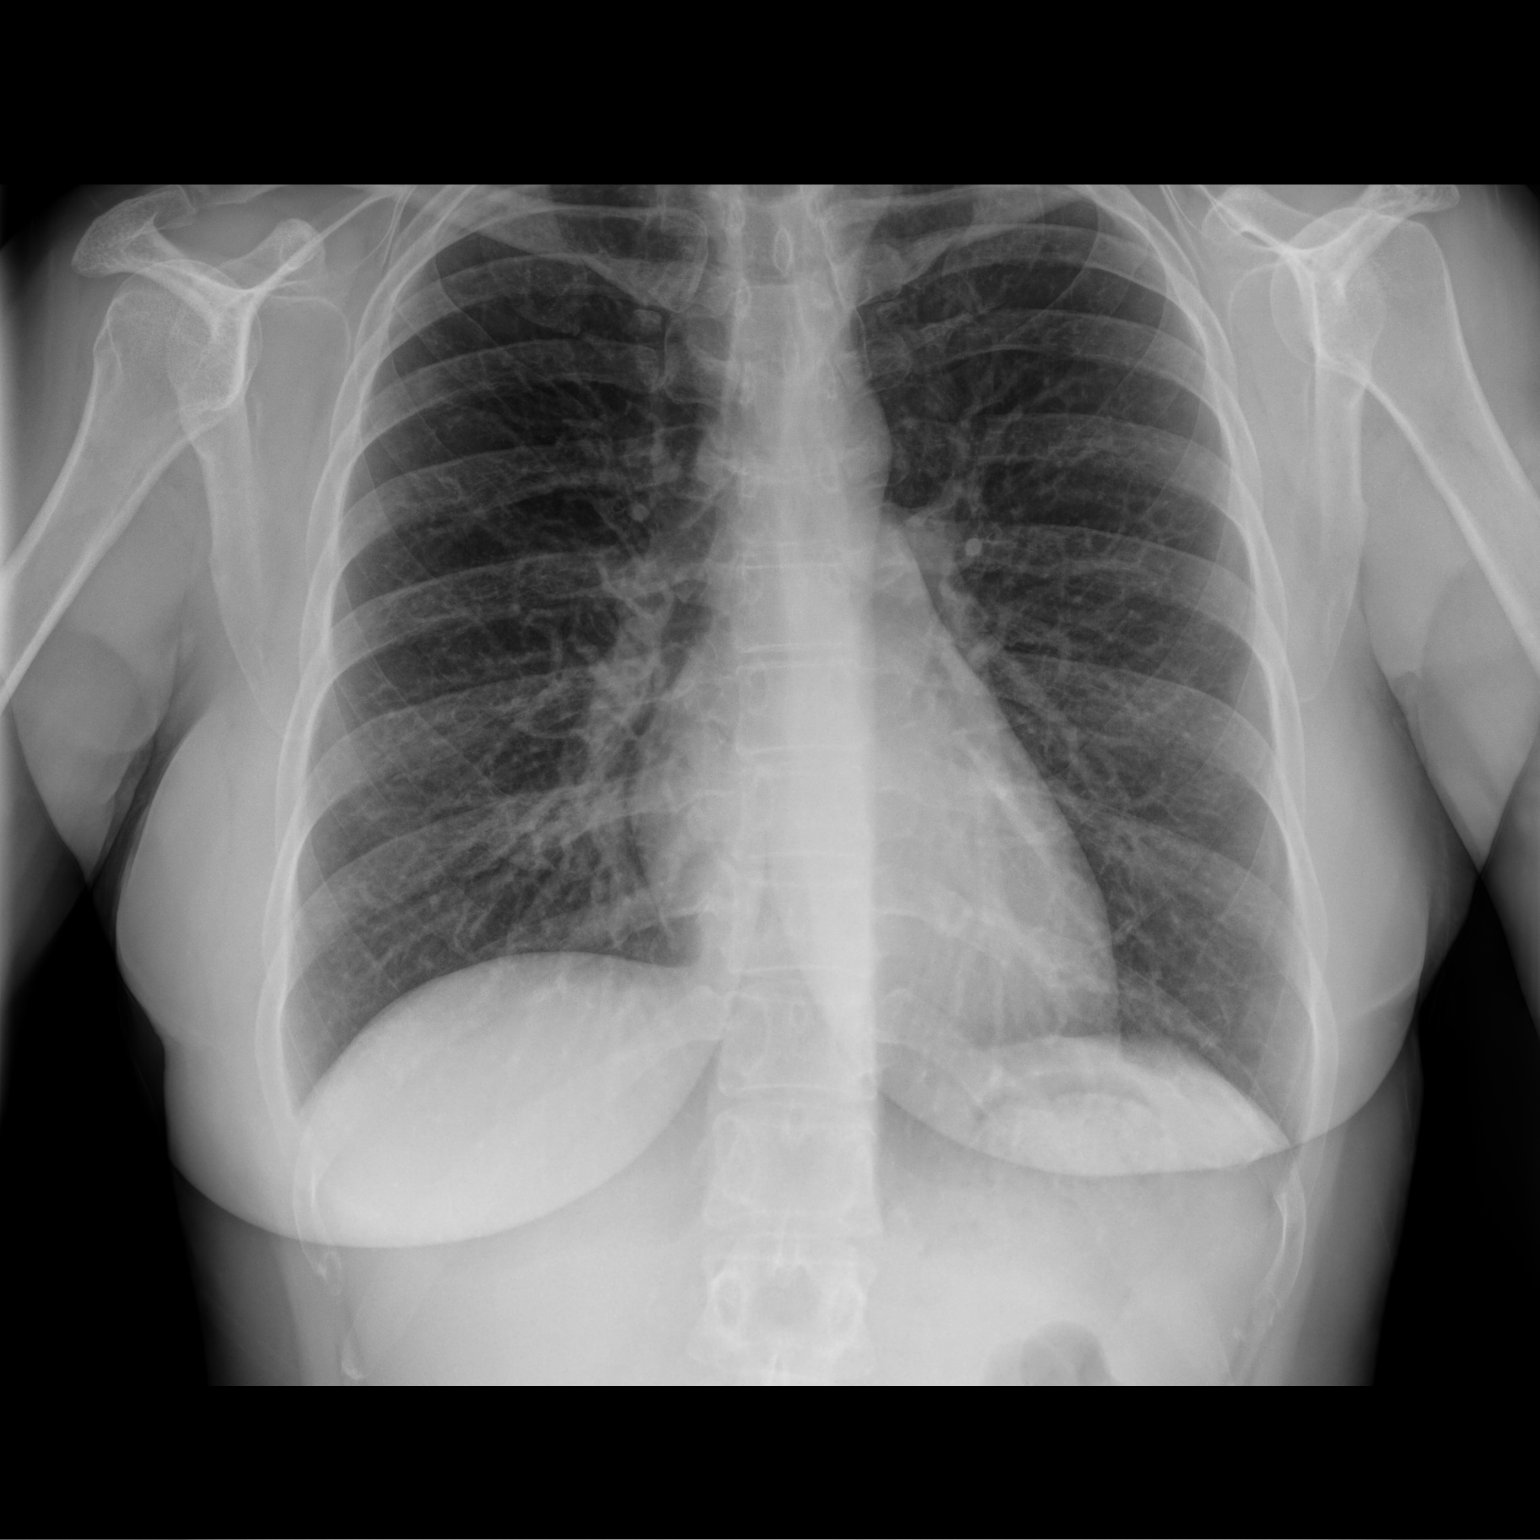

[chest lat]
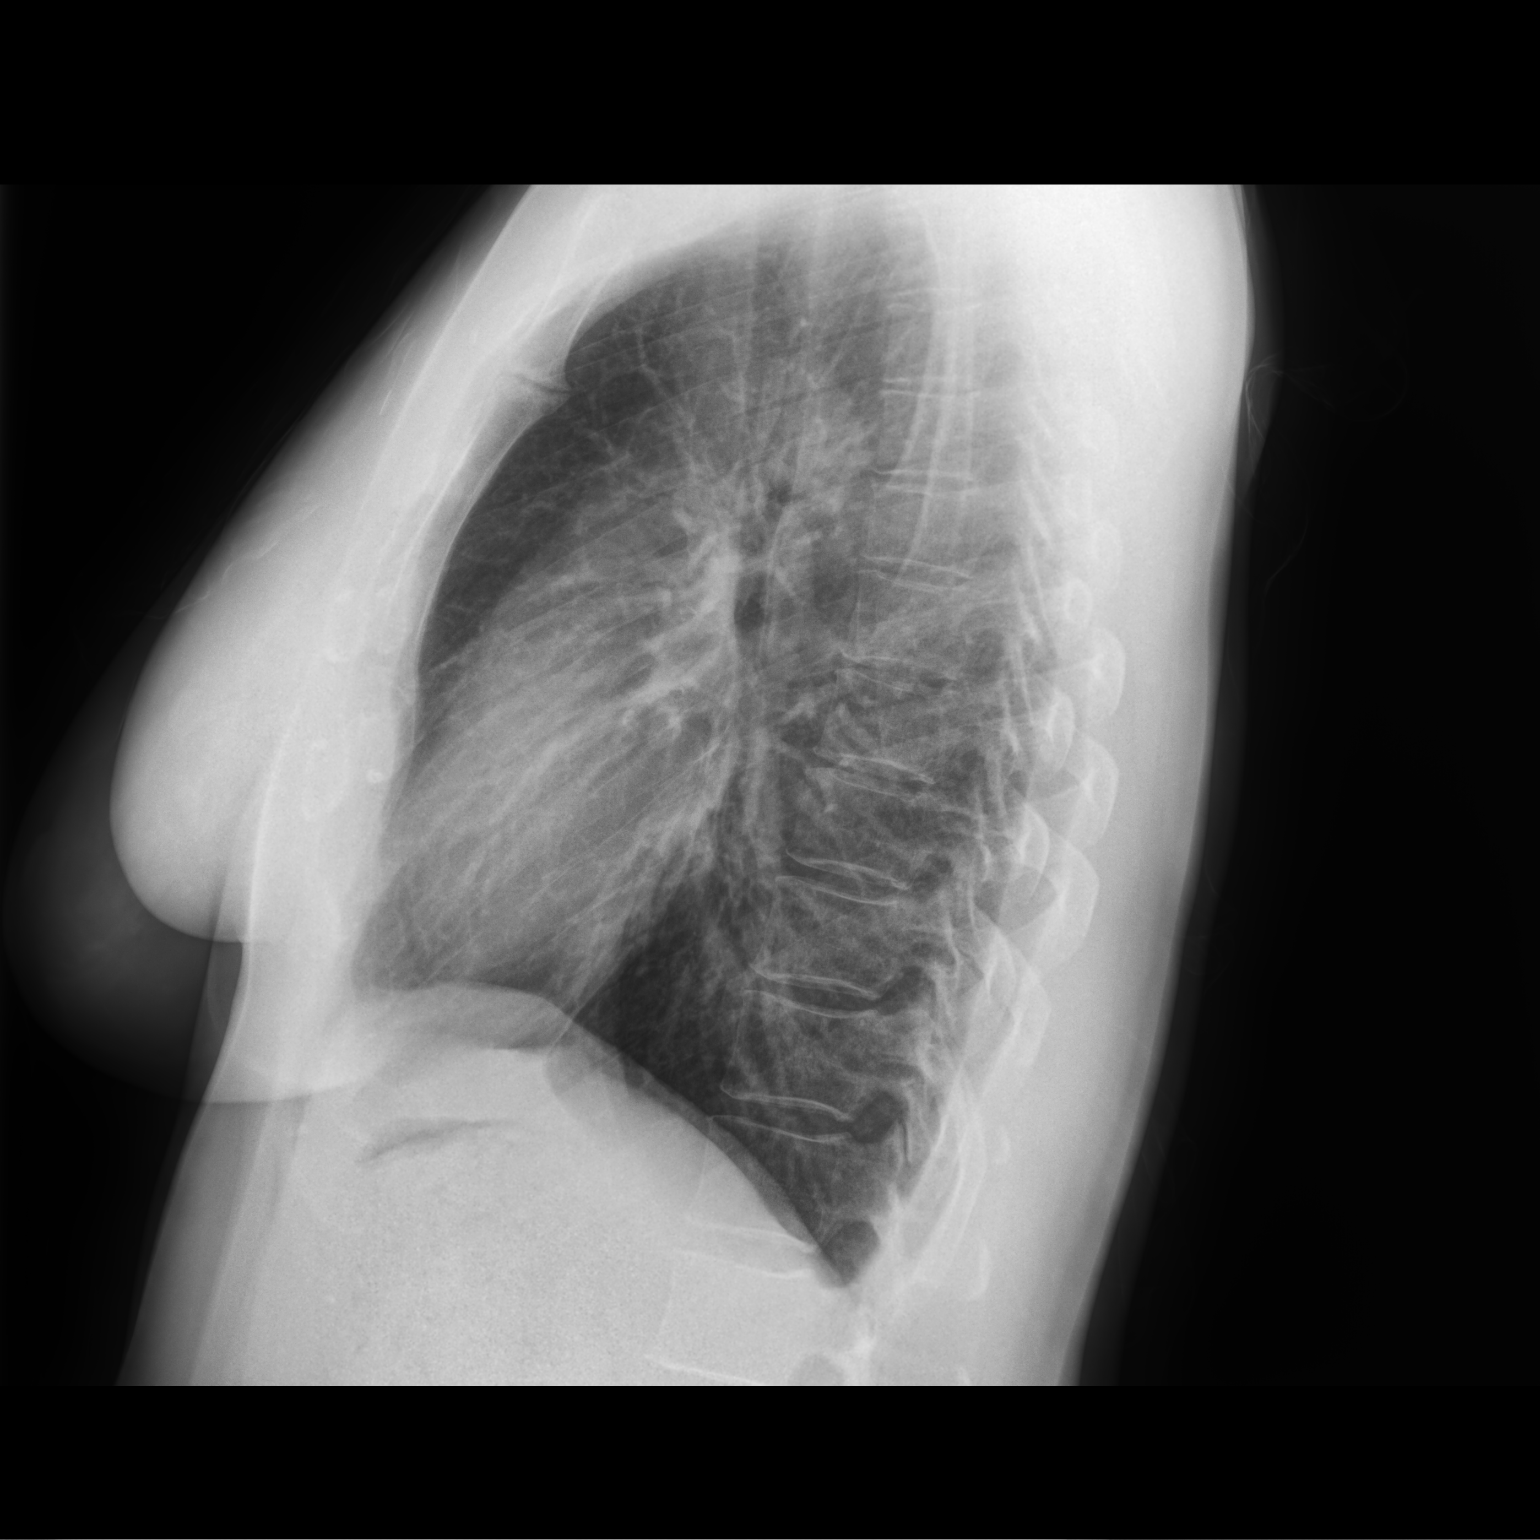

[2 of 2 positions shown; findings below may reference images not displayed]

FINDINGS: The cardiomediastinal contours are normal. The lungs are clear.
Pulmonary vasculature is normal. No consolidation, pleural effusion,
or pneumothorax. No acute osseous abnormalities are seen.
IMPRESSION: Negative radiographs of the chest.

## 2021-06-22 ENCOUNTER — Encounter (HOSPITAL_BASED_OUTPATIENT_CLINIC_OR_DEPARTMENT_OTHER): Payer: Self-pay | Admitting: Orthopedic Surgery

## 2021-06-22 ENCOUNTER — Other Ambulatory Visit: Payer: Self-pay

## 2021-06-28 ENCOUNTER — Encounter (HOSPITAL_BASED_OUTPATIENT_CLINIC_OR_DEPARTMENT_OTHER): Payer: Self-pay | Admitting: Orthopedic Surgery

## 2021-06-28 DIAGNOSIS — Z87898 Personal history of other specified conditions: Secondary | ICD-10-CM

## 2021-06-28 DIAGNOSIS — M93262 Osteochondritis dissecans, left knee: Secondary | ICD-10-CM

## 2021-06-28 DIAGNOSIS — S83242A Other tear of medial meniscus, current injury, left knee, initial encounter: Secondary | ICD-10-CM

## 2021-06-28 HISTORY — DX: Osteochondritis dissecans, left knee: M93.262

## 2021-06-28 HISTORY — DX: Personal history of other specified conditions: Z87.898

## 2021-06-28 HISTORY — DX: Other tear of medial meniscus, current injury, left knee, initial encounter: S83.242A

## 2021-06-28 NOTE — Progress Notes (Signed)
Sent text reminding pt to come pick up soap and drink.

## 2021-06-28 NOTE — H&P (Addendum)
Alison Gutierrez is an 45 y.o. female.   Chief Complaint: LEFT KNEE MEDIAL MENISCUS TEAR HPI: 7 YOF WITH HISTORY OF LEFT KNEE PAIN WITH A MRI SHOWS POSSIBLE MEDIAL MENISCUS TEAR WITH A LATERAL COMPARTMENT OCD  Past Medical History:  Diagnosis Date   Anxiety    Complication of anesthesia    POST-OP  URINARY RETENTION   Hashimoto's disease    Heavy menstrual bleeding    History of urinary retention    POST-OP EVERY SURGERY W/ ANESTHESIA  AND C/S   Hypothyroidism    Insomnia    Migraine    Pelvic pain in female    PONV (postoperative nausea and vomiting)     Past Surgical History:  Procedure Laterality Date   ABDOMINAL HYSTERECTOMY N/A 02/03/2015   Procedure: HYSTERECTOMY ABDOMINAL;  Surgeon: Mitchel Honour, DO;  Location: [facility];  Service: Gynecology;  Laterality: N/A;   BREAST REDUCTION SURGERY  age 71   CESAREAN SECTION  07-06-2006   DILITATION & CURRETTAGE/HYSTROSCOPY WITH THERMACHOICE ABLATION N/A 06/28/2013   Procedure: DILATATION & CURETTAGE/HYSTEROSCOPY;  Surgeon: Mitchel Honour, DO;  Location: [facility];  Service: Gynecology;  Laterality: N/A;   DILITATION & CURRETTAGE/HYSTROSCOPY WITH THERMACHOICE ABLATION N/A 07/10/2013   Procedure: DILATATION & CURETTAGE/HYSTEROSCOPY WITH THERMACHOICE ABLATION;  Surgeon: Mitchel Honour, DO;  Location: [facility];  Service: Gynecology;  Laterality: N/A;   EXCISION RIGHT BARTHOLIN GLAND  11-11-2011   LAPAROSCOPIC ASSISTED VAGINAL HYSTERECTOMY N/A 02/03/2015   Procedure:  ATTEMPTED LAPAROSCOPIC ASSISTED VAGINAL HYSTERECTOMY;  Surgeon: Mitchel Honour, DO;  Location: [facility];  Service: Gynecology;  Laterality: N/A;   LAPAROSCOPIC TUBAL LIGATION Bilateral 07/10/2013   Procedure: LAPAROSCOPIC TUBAL LIGATION with Filshie Clips;  Surgeon: Mitchel Honour, DO;  Location: [facility];  Service: Gynecology;  Laterality: Bilateral;   NASAL SEPTUM SURGERY  11-07-2003   SALPINGOOPHORECTOMY Bilateral 02/03/2015   Procedure:  LEFT SALPINGO  OOPHORECTOMY AND RIGHT FALLOPIAN TUBE REMOVAL;  Surgeon: Mitchel Honour, DO;  Location: [facility];  Service: Gynecology;  Laterality: Bilateral;   TUBAL LIGATION Bilateral 06/28/2013   Procedure: ESSURE TUBAL STERILIZATION;  Surgeon: Mitchel Honour, DO;  Location: [facility];  Service: Gynecology;  Laterality: Bilateral;   VULVAR LESION REMOVAL  11/11/2011   Procedure: VULVAR LESION;  Surgeon: Lenoard Aden, MD;  Location: [facility];  Service: Gynecology;  Laterality: N/A;  Excision   WISDOM TOOTH EXTRACTION      Family History  Problem Relation Age of Onset   Epilepsy Mother    Stroke Maternal Grandmother    Social History:  reports that she has never smoked. She has never used smokeless tobacco. She reports current alcohol use. She reports that she does not use drugs.  Allergies:  Allergies  Allergen Reactions   Lidocaine Shortness Of Breath and Other (See Comments)    bp went down and she needed epinephrine    No medications prior to admission.    No results found for this or any previous visit (from the past 48 hour(s)). No results found.  Review of Systems  Constitutional: Negative.   HENT: Negative.    Eyes: Negative.   Respiratory: Negative.    Cardiovascular: Negative.   Gastrointestinal: Negative.   Endocrine: Negative.   Genitourinary: Negative.   Musculoskeletal:  Positive for gait problem.  Skin: Negative.   Allergic/Immunologic: Negative.   Hematological: Negative.   Psychiatric/Behavioral: Negative.     Height 5\' 6"  (1.676 m), weight 77.1 kg. Physical Exam Constitutional:  Appearance: Normal appearance.  HENT:     Head: Normocephalic and atraumatic.     Right Ear: External ear normal.     Left Ear: External ear normal.     Nose: Nose normal.     Mouth/Throat:     Mouth: Mucous membranes are moist.  Eyes:     Conjunctiva/sclera: Conjunctivae normal.  Cardiovascular:     Rate and Rhythm: Normal rate.     Pulses: Normal pulses.   Pulmonary:     Effort: Pulmonary effort is normal.  Abdominal:     General: Bowel sounds are normal.  Musculoskeletal:        General: Swelling and tenderness present.     Cervical back: Neck supple.  Skin:    General: Skin is warm.     Capillary Refill: Capillary refill takes less than 2 seconds.  Neurological:     General: No focal deficit present.     Mental Status: She is alert.  Psychiatric:        Mood and Affect: Mood normal.     Assessment Principal Problem:   Osteochondritis dissecans of knee, left Active Problems:   Acute medial meniscus tear of left knee   History of urinary retention after every surgery   Plan At this point in time she has failed conservative care and we will proceed with a left knee arthroscopy with a partial medial meniscectomy and OCD debridement  Nalea Salce J Malachi Suderman, PA-C 06/28/2021, 10:07 AM

## 2021-06-28 NOTE — Progress Notes (Signed)

## 2021-06-29 ENCOUNTER — Ambulatory Visit (HOSPITAL_BASED_OUTPATIENT_CLINIC_OR_DEPARTMENT_OTHER)
Admission: RE | Admit: 2021-06-29 | Discharge: 2021-06-29 | Disposition: A | Discharge status: Home / Self Care | Payer: No Typology Code available for payment source | Attending: Orthopedic Surgery | Admitting: Orthopedic Surgery

## 2021-06-29 ENCOUNTER — Encounter (HOSPITAL_BASED_OUTPATIENT_CLINIC_OR_DEPARTMENT_OTHER): Payer: Self-pay | Admitting: Orthopedic Surgery

## 2021-06-29 ENCOUNTER — Encounter (HOSPITAL_BASED_OUTPATIENT_CLINIC_OR_DEPARTMENT_OTHER)
Admission: RE | Disposition: A | Discharge status: Home / Self Care | Payer: Self-pay | Source: Home / Self Care | Attending: Orthopedic Surgery

## 2021-06-29 ENCOUNTER — Ambulatory Visit (HOSPITAL_BASED_OUTPATIENT_CLINIC_OR_DEPARTMENT_OTHER): Payer: No Typology Code available for payment source | Admitting: Certified Registered"

## 2021-06-29 ENCOUNTER — Other Ambulatory Visit: Payer: Self-pay

## 2021-06-29 DIAGNOSIS — M659 Synovitis and tenosynovitis, unspecified: Secondary | ICD-10-CM | POA: Insufficient documentation

## 2021-06-29 DIAGNOSIS — M25462 Effusion, left knee: Secondary | ICD-10-CM | POA: Diagnosis not present

## 2021-06-29 DIAGNOSIS — Z87448 Personal history of other diseases of urinary system: Secondary | ICD-10-CM | POA: Insufficient documentation

## 2021-06-29 DIAGNOSIS — M25562 Pain in left knee: Secondary | ICD-10-CM | POA: Insufficient documentation

## 2021-06-29 DIAGNOSIS — R269 Unspecified abnormalities of gait and mobility: Secondary | ICD-10-CM | POA: Diagnosis not present

## 2021-06-29 DIAGNOSIS — Z8739 Personal history of other diseases of the musculoskeletal system and connective tissue: Secondary | ICD-10-CM | POA: Diagnosis not present

## 2021-06-29 DIAGNOSIS — S83242D Other tear of medial meniscus, current injury, left knee, subsequent encounter: Secondary | ICD-10-CM

## 2021-06-29 DIAGNOSIS — Z87898 Personal history of other specified conditions: Secondary | ICD-10-CM

## 2021-06-29 DIAGNOSIS — M94262 Chondromalacia, left knee: Secondary | ICD-10-CM | POA: Insufficient documentation

## 2021-06-29 DIAGNOSIS — S83242A Other tear of medial meniscus, current injury, left knee, initial encounter: Secondary | ICD-10-CM | POA: Diagnosis present

## 2021-06-29 DIAGNOSIS — M93262 Osteochondritis dissecans, left knee: Secondary | ICD-10-CM

## 2021-06-29 HISTORY — PX: KNEE ARTHROSCOPY WITH MEDIAL MENISECTOMY: SHX5651

## 2021-06-29 LAB — CBC WITH DIFFERENTIAL/PLATELET
Abs Immature Granulocytes: 0.02 10*3/uL (ref 0.00–0.07)
Basophils Absolute: 0 10*3/uL (ref 0.0–0.1)
Basophils Relative: 1 %
Eosinophils Absolute: 0.1 10*3/uL (ref 0.0–0.5)
Eosinophils Relative: 2 %
HCT: 39.8 % (ref 36.0–46.0)
Hemoglobin: 13.8 g/dL (ref 12.0–15.0)
Immature Granulocytes: 1 %
Lymphocytes Relative: 36 %
Lymphs Abs: 1.4 10*3/uL (ref 0.7–4.0)
MCH: 32.2 pg (ref 26.0–34.0)
MCHC: 34.7 g/dL (ref 30.0–36.0)
MCV: 93 fL (ref 80.0–100.0)
Monocytes Absolute: 0.4 10*3/uL (ref 0.1–1.0)
Monocytes Relative: 9 %
Neutro Abs: 2.1 10*3/uL (ref 1.7–7.7)
Neutrophils Relative %: 51 %
Platelets: 247 10*3/uL (ref 150–400)
RBC: 4.28 MIL/uL (ref 3.87–5.11)
RDW: 12.3 % (ref 11.5–15.5)
WBC: 4 10*3/uL (ref 4.0–10.5)
nRBC: 0 % (ref 0.0–0.2)

## 2021-06-29 LAB — POCT PREGNANCY, URINE: Preg Test, Ur: NEGATIVE

## 2021-06-29 LAB — POCT HEMOGLOBIN-HEMACUE: Hemoglobin: 14.1 g/dL (ref 12.0–15.0)

## 2021-06-29 SURGERY — ARTHROSCOPY, KNEE, WITH MEDIAL MENISCECTOMY
Anesthesia: General | Site: Knee | Laterality: Left

## 2021-06-29 MED ORDER — ONDANSETRON HCL 4 MG/2ML IJ SOLN
INTRAMUSCULAR | Status: DC | PRN
  Administered 2021-06-29: 4 mg via INTRAVENOUS

## 2021-06-29 MED ORDER — MIDAZOLAM HCL 2 MG/2ML IJ SOLN
2.0000 mg | Freq: Once | INTRAMUSCULAR | Status: AC
  Administered 2021-06-29: 2 mg via INTRAVENOUS

## 2021-06-29 MED ORDER — HYDROCODONE-ACETAMINOPHEN 5-325 MG PO TABS: 1.0000 | ORAL_TABLET | Freq: Four times a day (QID) | ORAL | 0 refills | Status: AC | PRN

## 2021-06-29 MED ORDER — OXYCODONE HCL 5 MG PO TABS
5.0000 mg | ORAL_TABLET | Freq: Once | ORAL | Status: AC | PRN
  Administered 2021-06-29: 5 mg via ORAL

## 2021-06-29 MED ORDER — LIDOCAINE HCL (CARDIAC) PF 100 MG/5ML IV SOSY
PREFILLED_SYRINGE | INTRAVENOUS | Status: DC | PRN
  Administered 2021-06-29: 80 mg via INTRAVENOUS

## 2021-06-29 MED ORDER — PROPOFOL 10 MG/ML IV BOLUS
INTRAVENOUS | Status: DC | PRN
  Administered 2021-06-29: 160 mg via INTRAVENOUS

## 2021-06-29 MED ORDER — FENTANYL CITRATE (PF) 100 MCG/2ML IJ SOLN
INTRAMUSCULAR | Status: AC
  Filled 2021-06-29: qty 2

## 2021-06-29 MED ORDER — DEXAMETHASONE SODIUM PHOSPHATE 10 MG/ML IJ SOLN
INTRAMUSCULAR | Status: AC
  Filled 2021-06-29: qty 1

## 2021-06-29 MED ORDER — SCOPOLAMINE 1 MG/3DAYS TD PT72
1.0000 | MEDICATED_PATCH | Freq: Once | TRANSDERMAL | Status: DC
  Administered 2021-06-29: 1.5 mg via TRANSDERMAL

## 2021-06-29 MED ORDER — POVIDONE-IODINE 7.5 % EX SOLN
Freq: Once | CUTANEOUS | Status: DC
  Filled 2021-06-29: qty 118

## 2021-06-29 MED ORDER — POVIDONE-IODINE 10 % EX SWAB
2.0000 "application " | Freq: Once | CUTANEOUS | Status: AC
  Administered 2021-06-29: 2 via TOPICAL

## 2021-06-29 MED ORDER — CEFAZOLIN SODIUM-DEXTROSE 2-4 GM/100ML-% IV SOLN
2.0000 g | INTRAVENOUS | Status: AC
  Administered 2021-06-29: 2 g via INTRAVENOUS

## 2021-06-29 MED ORDER — DEXAMETHASONE SODIUM PHOSPHATE 4 MG/ML IJ SOLN
INTRAMUSCULAR | Status: DC | PRN
  Administered 2021-06-29: 8 mg via INTRAVENOUS

## 2021-06-29 MED ORDER — ACETAMINOPHEN 500 MG PO TABS: 1000.0000 mg | ORAL_TABLET | Freq: Once | ORAL | Status: DC

## 2021-06-29 MED ORDER — PROPOFOL 500 MG/50ML IV EMUL
INTRAVENOUS | Status: AC
  Filled 2021-06-29: qty 50

## 2021-06-29 MED ORDER — PROPOFOL 500 MG/50ML IV EMUL
INTRAVENOUS | Status: DC | PRN
  Administered 2021-06-29: 100 ug/kg/min via INTRAVENOUS

## 2021-06-29 MED ORDER — ACETAMINOPHEN 500 MG PO TABS
ORAL_TABLET | ORAL | Status: AC
  Filled 2021-06-29: qty 2

## 2021-06-29 MED ORDER — CEFAZOLIN SODIUM-DEXTROSE 2-4 GM/100ML-% IV SOLN
INTRAVENOUS | Status: AC
  Filled 2021-06-29: qty 100

## 2021-06-29 MED ORDER — BUPIVACAINE-EPINEPHRINE (PF) 0.25% -1:200000 IJ SOLN
INTRAMUSCULAR | Status: AC
  Filled 2021-06-29: qty 30

## 2021-06-29 MED ORDER — LIDOCAINE 2% (20 MG/ML) 5 ML SYRINGE
INTRAMUSCULAR | Status: AC
  Filled 2021-06-29: qty 5

## 2021-06-29 MED ORDER — MIDAZOLAM HCL 2 MG/2ML IJ SOLN
INTRAMUSCULAR | Status: AC
  Filled 2021-06-29: qty 2

## 2021-06-29 MED ORDER — FENTANYL CITRATE (PF) 100 MCG/2ML IJ SOLN
100.0000 ug | Freq: Once | INTRAMUSCULAR | Status: AC
  Administered 2021-06-29: 100 ug via INTRAVENOUS

## 2021-06-29 MED ORDER — SCOPOLAMINE 1 MG/3DAYS TD PT72
MEDICATED_PATCH | TRANSDERMAL | Status: AC
  Filled 2021-06-29: qty 1

## 2021-06-29 MED ORDER — METHYLPREDNISOLONE ACETATE 80 MG/ML IJ SUSP
INTRAMUSCULAR | Status: AC
  Filled 2021-06-29: qty 1

## 2021-06-29 MED ORDER — SCOPOLAMINE 1 MG/3DAYS TD PT72: 1.0000 | MEDICATED_PATCH | Freq: Once | TRANSDERMAL | Status: DC

## 2021-06-29 MED ORDER — LACTATED RINGERS IV SOLN: INTRAVENOUS | Status: DC

## 2021-06-29 MED ORDER — ACETAMINOPHEN 500 MG PO TABS
1000.0000 mg | ORAL_TABLET | Freq: Once | ORAL | Status: AC
  Administered 2021-06-29: 1000 mg via ORAL

## 2021-06-29 MED ORDER — DEXAMETHASONE SODIUM PHOSPHATE 10 MG/ML IJ SOLN: 8.0000 mg | Freq: Once | INTRAMUSCULAR | Status: DC

## 2021-06-29 MED ORDER — METHYLPREDNISOLONE ACETATE 80 MG/ML IJ SUSP
INTRAMUSCULAR | Status: DC | PRN
  Administered 2021-06-29: 80 mg via INTRA_ARTICULAR

## 2021-06-29 MED ORDER — OXYCODONE HCL 5 MG PO TABS
ORAL_TABLET | ORAL | Status: AC
  Filled 2021-06-29: qty 1

## 2021-06-29 MED ORDER — BUPIVACAINE-EPINEPHRINE 0.25% -1:200000 IJ SOLN
INTRAMUSCULAR | Status: DC | PRN
  Administered 2021-06-29: 10 mL

## 2021-06-29 MED ORDER — PROMETHAZINE HCL 25 MG/ML IJ SOLN: 6.2500 mg | INTRAMUSCULAR | Status: DC | PRN

## 2021-06-29 MED ORDER — FENTANYL CITRATE (PF) 100 MCG/2ML IJ SOLN: 25.0000 ug | INTRAMUSCULAR | Status: DC | PRN

## 2021-06-29 MED ORDER — OXYCODONE HCL 5 MG/5ML PO SOLN: 5.0000 mg | Freq: Once | ORAL | Status: AC | PRN

## 2021-06-29 MED ORDER — ONDANSETRON HCL 4 MG/2ML IJ SOLN
INTRAMUSCULAR | Status: AC
  Filled 2021-06-29: qty 2

## 2021-06-29 MED ORDER — BUPIVACAINE-EPINEPHRINE (PF) 0.5% -1:200000 IJ SOLN
INTRAMUSCULAR | Status: DC | PRN
  Administered 2021-06-29: 30 mL

## 2021-06-29 MED ORDER — MIDAZOLAM HCL 5 MG/5ML IJ SOLN
INTRAMUSCULAR | Status: DC | PRN
  Administered 2021-06-29: 2 mg via INTRAVENOUS

## 2021-06-29 MED ORDER — PROPOFOL 10 MG/ML IV BOLUS
INTRAVENOUS | Status: AC
  Filled 2021-06-29: qty 20

## 2021-06-29 MED ORDER — EPINEPHRINE PF 1 MG/ML IJ SOLN
INTRAMUSCULAR | Status: AC
  Filled 2021-06-29: qty 2

## 2021-06-29 MED ORDER — SODIUM CHLORIDE 0.9 % IR SOLN
Status: DC | PRN
  Administered 2021-06-29: 3000 mL

## 2021-06-29 SURGICAL SUPPLY — 57 items
APL SKNCLS STERI-STRIP NONHPOA (GAUZE/BANDAGES/DRESSINGS)
BANDAGE ESMARK 6X9 LF (GAUZE/BANDAGES/DRESSINGS) IMPLANT
BENZOIN TINCTURE PRP APPL 2/3 (GAUZE/BANDAGES/DRESSINGS) IMPLANT
BLADE EXCALIBUR 4.0X13 (MISCELLANEOUS) IMPLANT
BLADE SURG 15 STRL LF DISP TIS (BLADE) IMPLANT
BLADE SURG 15 STRL SS (BLADE)
BNDG CMPR 9X6 STRL LF SNTH (GAUZE/BANDAGES/DRESSINGS)
BNDG COHESIVE 4X5 TAN ST LF (GAUZE/BANDAGES/DRESSINGS) IMPLANT
BNDG ELASTIC 6X5.8 VLCR STR LF (GAUZE/BANDAGES/DRESSINGS) ×3 IMPLANT
BNDG ESMARK 6X9 LF (GAUZE/BANDAGES/DRESSINGS)
DISSECTOR  3.8MM X 13CM (MISCELLANEOUS) ×2
DISSECTOR 3.8MM X 13CM (MISCELLANEOUS) ×2 IMPLANT
DISSECTOR 4.0MM X 13CM (MISCELLANEOUS) IMPLANT
DRAPE ARTHROSCOPY W/POUCH 90 (DRAPES) ×3 IMPLANT
DURAPREP 26ML APPLICATOR (WOUND CARE) ×3 IMPLANT
EXCALIBUR 3.8MM X 13CM (MISCELLANEOUS) IMPLANT
GAUZE SPONGE 4X4 12PLY STRL (GAUZE/BANDAGES/DRESSINGS) ×3 IMPLANT
GAUZE XEROFORM 1X8 LF (GAUZE/BANDAGES/DRESSINGS) ×3 IMPLANT
GLOVE SURG ENC MOIS LTX SZ7 (GLOVE) ×3 IMPLANT
GLOVE SURG MICRO LTX SZ7.5 (GLOVE) ×3 IMPLANT
GLOVE SURG UNDER POLY LF SZ7 (GLOVE) ×3 IMPLANT
GLOVE SURG UNDER POLY LF SZ7.5 (GLOVE) ×3 IMPLANT
GOWN STRL REUS W/ TWL LRG LVL3 (GOWN DISPOSABLE) ×4 IMPLANT
GOWN STRL REUS W/ TWL XL LVL3 (GOWN DISPOSABLE) ×2 IMPLANT
GOWN STRL REUS W/TWL LRG LVL3 (GOWN DISPOSABLE) ×4
GOWN STRL REUS W/TWL XL LVL3 (GOWN DISPOSABLE)
HOLDER KNEE FOAM BLUE (MISCELLANEOUS) ×3 IMPLANT
K-WIRE .062X4 (WIRE) IMPLANT
MANIFOLD NEPTUNE II (INSTRUMENTS) ×3 IMPLANT
NDL SAFETY ECLIPSE 18X1.5 (NEEDLE) ×4 IMPLANT
NEEDLE HYPO 18GX1.5 SHARP (NEEDLE) ×2
NEEDLE HYPO 22GX1.5 SAFETY (NEEDLE) ×2 IMPLANT
PACK ARTHROSCOPY DSU (CUSTOM PROCEDURE TRAY) ×3 IMPLANT
PACK BASIN DAY SURGERY FS (CUSTOM PROCEDURE TRAY) ×3 IMPLANT
PAD ALCOHOL SWAB (MISCELLANEOUS) IMPLANT
PENCIL SMOKE EVACUATOR (MISCELLANEOUS) ×3 IMPLANT
PORT APPOLLO RF 90DEGREE MULTI (SURGICAL WAND) IMPLANT
STRIP CLOSURE SKIN 1/2X4 (GAUZE/BANDAGES/DRESSINGS) IMPLANT
SUCTION FRAZIER HANDLE 10FR (MISCELLANEOUS)
SUCTION TUBE FRAZIER 10FR DISP (MISCELLANEOUS) IMPLANT
SUT ETHILON 4 0 PS 2 18 (SUTURE) ×3 IMPLANT
SUT FIBERWIRE #2 38 T-5 BLUE (SUTURE)
SUT PDS AB 0 CT 36 (SUTURE) IMPLANT
SUT PROLENE 3 0 PS 2 (SUTURE) IMPLANT
SUT VIC AB 0 CT1 18XCR BRD 8 (SUTURE) IMPLANT
SUT VIC AB 0 CT1 8-18 (SUTURE)
SUT VIC AB 2-0 CT1 27 (SUTURE)
SUT VIC AB 2-0 CT1 TAPERPNT 27 (SUTURE) IMPLANT
SUT VIC AB 3-0 PS1 18 (SUTURE)
SUT VIC AB 3-0 PS1 18XBRD (SUTURE) IMPLANT
SUTURE FIBERWR #2 38 T-5 BLUE (SUTURE) IMPLANT
SYR 20ML LL LF (SYRINGE) IMPLANT
SYR 5ML LL (SYRINGE) ×3 IMPLANT
TOWEL GREEN STERILE FF (TOWEL DISPOSABLE) ×3 IMPLANT
TUBING ARTHROSCOPY IRRIG 16FT (MISCELLANEOUS) ×3 IMPLANT
WATER STERILE IRR 1000ML POUR (IV SOLUTION) ×3 IMPLANT
WRAP KNEE MAXI GEL POST OP (GAUZE/BANDAGES/DRESSINGS) ×3 IMPLANT

## 2021-06-29 NOTE — Progress Notes (Signed)
Assisted Dr. Howze with left, knee block. Side rails up, monitors on throughout procedure. See vital signs in flow sheet. Tolerated Procedure well. 

## 2021-06-29 NOTE — Discharge Instructions (Addendum)
Knee arthroscopy  Care After Refer to this sheet in the next few weeks. These discharge instructions provide you with general information on caring for yourself after you leave the hospital. Your caregiver may also give you specific instructions. Your treatment has been planned according to the most current medical practices available, but unavoidable complications sometimes occur. If you have any problems or questions after discharge, please call your caregiver.  HOME INSTRUCTIONS You may resume a normal diet and activities as directed. Perform exercises as directed.  Take showers instead of baths until informed otherwise.  Change bandages (dressings) in 3 days.  Swab wounds daily with betadine.  Wash leg with soap and water.  Pat dry.  Cover wounds with bandaids. Only take over-the-counter or prescription medicines for pain, discomfort, or fever as directed by your caregiver.  Eat a well-balanced diet.  Avoid lifting or driving until you are instructed otherwise.  Make an appointment to see your caregiver for stitches (suture) or staple removal as directed.   You can use crutches for the first few days after surgery to help you ambulate You can begin to increase the amount of weight on your operative leg as you tolerate  Use the Cryocuff or Ice as often as possible for the first 7 days, then as needed for pain relief.  - Always keep a towel, ACE wrap or other barrier between the cooling unit and your skin.   SEEK MEDICAL CARE IF: You have swelling of your calf or leg.  You develop shortness of breath or chest pain.  You have redness, swelling, or increasing pain in the wound.  There is pus or any unusual drainage coming from the surgical site.  You notice a bad smell coming from the surgical site or dressing.  The surgical site breaks open after sutures or staples have been removed.  There is persistent bleeding from the suture or staple line.  You are getting worse or are not  improving.  You have any other questions or concerns.   SEEK IMMEDIATE MEDICAL CARE IF:  You have a fever.  You develop a rash.  You have difficulty breathing.  You develop any reaction or side effects to medicines given.  Your knee motion is decreasing rather than improving.  MAKE SURE YOU:  Understand these instructions.  Will watch your condition.  Will get help right away if you are not doing well or get worse.    Post Anesthesia Home Care Instructions  Activity: Get plenty of rest for the remainder of the day. A responsible individual must stay with you for 24 hours following the procedure.  For the next 24 hours, DO NOT: -Drive a car -Advertising copywriter -Drink alcoholic beverages -Take any medication unless instructed by your physician -Make any legal decisions or sign important papers.  Meals: Start with liquid foods such as gelatin or soup. Progress to regular foods as tolerated. Avoid greasy, spicy, heavy foods. If nausea and/or vomiting occur, drink only clear liquids until the nausea and/or vomiting subsides. Call your physician if vomiting continues.  Special Instructions/Symptoms: Your throat may feel dry or sore from the anesthesia or the breathing tube placed in your throat during surgery. If this causes discomfort, gargle with warm salt water. The discomfort should disappear within 24 hours.  If you had a scopolamine patch placed behind your ear for the management of post- operative nausea and/or vomiting:  1. The medication in the patch is effective for 72 hours, after which it should be removed.  Wrap patch in a tissue and discard in the trash. Wash hands thoroughly with soap and water. 2. You may remove the patch earlier than 72 hours if you experience unpleasant side effects which may include dry mouth, dizziness or visual disturbances. 3. Avoid touching the patch. Wash your hands with soap and water after contact with the patch.  Regional Anesthesia  Blocks  1. Numbness or the inability to move the "blocked" extremity may last from 3-48 hours after placement. The length of time depends on the medication injected and your individual response to the medication. If the numbness is not going away after 48 hours, call your surgeon.  2. The extremity that is blocked will need to be protected until the numbness is gone and the  Strength has returned. Because you cannot feel it, you will need to take extra care to avoid injury. Because it may be weak, you may have difficulty moving it or using it. You may not know what position it is in without looking at it while the block is in effect.  3. For blocks in the legs and feet, returning to weight bearing and walking needs to be done carefully. You will need to wait until the numbness is entirely gone and the strength has returned. You should be able to move your leg and foot normally before you try and bear weight or walk. You will need someone to be with you when you first try to ensure you do not fall and possibly risk injury.  4. Bruising and tenderness at the needle site are common side effects and will resolve in a few days.  5. Persistent numbness or new problems with movement should be communicated to the surgeon or the [facility] [phone] [facility] [phone]).   *May have Tylenol today at 4:45pm 06/29/21  Oxycodone 5mg  given at 1:30pm today

## 2021-06-29 NOTE — Anesthesia Procedure Notes (Signed)
Anesthesia Regional Block: Knee block   Pre-Anesthetic Checklist: , timeout performed,  Correct Patient, Correct Site, Correct Laterality,  Correct Procedure, Correct Position, site marked,  Risks and benefits discussed,  Pre-op evaluation,  At surgeon's request and post-op pain management  Laterality: Left  Prep: Maximum Sterile Barrier Precautions used, chloraprep       Needles:  Injection technique: Single-shot  Needle Type: Echogenic Needle     Needle Length: 4cm  Needle Gauge: 18     Additional Needles:   Narrative:  Start time: 06/29/2021 11:49 AM End time: 06/29/2021 11:52 AM  Performed by: Personally  Anesthesiologist: Kaylyn Layer, MD  Additional Notes: Risks, benefits, and alternative discussed. Patient gave consent for procedure. Patient prepped and draped in sterile fashion. Sedation administered, patient remains easily responsive to voice. Local anesthetic given in 5cc increments with no signs or symptoms of intravascular injection. No pain or paraesthesias with injection. Patient monitored throughout procedure with signs of LAST or immediate complications. Tolerated well.   Alison Greenhouse, MD

## 2021-06-29 NOTE — Transfer of Care (Signed)
Immediate Anesthesia Transfer of Care Note  Patient: Alison Gutierrez  Procedure(s) Performed: KNEE ARTHROSCOPY WITH MEDIAL MENISECTOMY (Left: Knee)  Patient Location: PACU  Anesthesia Type:General and Regional  Level of Consciousness: drowsy  Airway & Oxygen Therapy: Patient Spontanous Breathing and Patient connected to face mask oxygen  Post-op Assessment: Report given to RN and Post -op Vital signs reviewed and stable  Post vital signs: Reviewed and stable  Last Vitals:  Vitals Value Taken Time  BP    Temp    Pulse 51 06/29/21 1250  Resp 10 06/29/21 1250  SpO2 100 % 06/29/21 1250  Vitals shown include unvalidated device data.  Last Pain:  Vitals:   06/29/21 1036  PainSc: 0-No pain      Patients Stated Pain Goal: 3 (06/29/21 1036)  Complications: No notable events documented.

## 2021-06-29 NOTE — Interval H&P Note (Signed)
History and Physical Interval Note:  06/29/2021 10:50 AM  Alison Gutierrez  has presented today for surgery, with the diagnosis of LEFT KNEE MEDIAL MENISCUS INJURY, LOOSR BODY.  The various methods of treatment have been discussed with the patient and family. After consideration of risks, benefits and other options for treatment, the patient has consented to  Procedure(s): KNEE ARTHROSCOPY WITH MEDIAL MENISECTOMY (Left) ARTHROSCOPY KNEE/LOOSE BODY REMOVAL (Left) as a surgical intervention.  The patient's history has been reviewed, patient examined, no change in status, stable for surgery.  I have reviewed the patient's chart and labs.  Questions were answered to the patient's satisfaction.     Nilda Simmer

## 2021-06-29 NOTE — Anesthesia Procedure Notes (Signed)
Procedure Name: LMA Insertion Date/Time: 06/29/2021 12:14 PM Performed by: Alford Highland, CRNA Pre-anesthesia Checklist: Patient identified, Emergency Drugs available, Suction available and Patient being monitored Patient Re-evaluated:Patient Re-evaluated prior to induction Oxygen Delivery Method: Circle System Utilized Preoxygenation: Pre-oxygenation with 100% oxygen Induction Type: IV induction Ventilation: Mask ventilation without difficulty LMA: LMA inserted LMA Size: 4.0 Number of attempts: 1 Airway Equipment and Method: Bite block Placement Confirmation: positive ETCO2 Tube secured with: Tape Dental Injury: Teeth and Oropharynx as per pre-operative assessment

## 2021-06-29 NOTE — Anesthesia Postprocedure Evaluation (Signed)
Anesthesia Post Note  Patient: Alison Gutierrez  Procedure(s) Performed: KNEE ARTHROSCOPY WITH MEDIAL MENISECTOMY (Left: Knee)     Patient location during evaluation: PACU Anesthesia Type: General Level of consciousness: awake and alert and oriented Pain management: pain level controlled Vital Signs Assessment: post-procedure vital signs reviewed and stable Respiratory status: spontaneous breathing, nonlabored ventilation and respiratory function stable Cardiovascular status: blood pressure returned to baseline Postop Assessment: no apparent nausea or vomiting Anesthetic complications: no   No notable events documented.  Last Vitals:  Vitals:   06/29/21 1315 06/29/21 1329  BP: 103/65 118/88  Pulse: (!) 51 60  Resp: (!) 9 18  Temp:  36.6 C  SpO2: 98% 100%    Last Pain:  Vitals:   06/29/21 1329  TempSrc: Oral  PainSc: 2                  Shanda Howells

## 2021-06-29 NOTE — Anesthesia Preprocedure Evaluation (Addendum)
Anesthesia Evaluation  Patient identified by MRN, date of birth, ID band Patient awake    Reviewed: Allergy & Precautions, NPO status , Patient's Chart, lab work & pertinent test results  History of Anesthesia Complications (+) PONV and history of anesthetic complications  Airway Mallampati: II  TM Distance: >3 FB Neck ROM: Full    Dental no notable dental hx.    Pulmonary neg pulmonary ROS,    Pulmonary exam normal        Cardiovascular negative cardio ROS Normal cardiovascular exam     Neuro/Psych  Headaches, Anxiety    GI/Hepatic negative GI ROS, Neg liver ROS,   Endo/Other  Hypothyroidism   Renal/GU negative Renal ROS  negative genitourinary   Musculoskeletal LEFT KNEE MEDIAL MENISCUS INJURY, LOOSE BODY   Abdominal   Peds  Hematology negative hematology ROS (+)   Anesthesia Other Findings Day of surgery medications reviewed with patient.  Reproductive/Obstetrics negative OB ROS                            Anesthesia Physical Anesthesia Plan  ASA: 2  Anesthesia Plan: General   Post-op Pain Management: Regional block and Tylenol PO (pre-op)   Induction: Intravenous  PONV Risk Score and Plan: 4 or greater and Scopolamine patch - Pre-op, Midazolam, Ondansetron, Dexamethasone and Treatment may vary due to age or medical condition  Airway Management Planned: LMA  Additional Equipment: None  Intra-op Plan:   Post-operative Plan: Extubation in OR  Informed Consent: I have reviewed the patients History and Physical, chart, labs and discussed the procedure including the risks, benefits and alternatives for the proposed anesthesia with the patient or authorized representative who has indicated his/her understanding and acceptance.     Dental advisory given  Plan Discussed with: CRNA  Anesthesia Plan Comments:        Anesthesia Quick Evaluation

## 2021-06-30 NOTE — Progress Notes (Signed)
Left message stating courtesy call and if any questions or concerns please call the doctors office.  

## 2021-07-01 ENCOUNTER — Encounter (HOSPITAL_BASED_OUTPATIENT_CLINIC_OR_DEPARTMENT_OTHER): Payer: Self-pay | Admitting: Orthopedic Surgery

## 2021-07-05 NOTE — Op Note (Signed)
NAME: Alison Gutierrez, Alison Gutierrez MEDICAL RECORD NO: [id number] ACCOUNT NO: [id number] DATE OF BIRTH: 02-Jul-1976 FACILITY: [facility] LOCATION: [facility] PHYSICIAN: Elana Alm. Thurston Hole, MD  Operative Report   DATE OF PROCEDURE: 06/29/2021  PREOPERATIVE DIAGNOSES: 1.  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 2.  Left knee healed osteochondritis dissecans.  POSTOPERATIVE DIAGNOSES: 1.  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 2.  Left knee healed osteochondritis dissecans.  PROCEDURE:  Left knee EUA followed by arthroscopic synovectomy with chondroplasty.  SURGEON:  Elana Alm. Thurston Hole, MD  ANESTHESIA:  General.  OPERATIVE TIME: 30 minutes.  COMPLICATIONS:  None.  INDICATIONS FOR PROCEDURE:  The patient is a 45 year old who sustained a twisting injury to her left knee 6 months ago.  Exam x-ray and MRI has revealed a lateral femoral condyle OCD lesion with possible medial meniscus tear and synovitis.  She has  failed conservative care, and is now to undergo arthroscopy.  DESCRIPTION OF PROCEDURE:  The patient was brought to the operating room on 06/29/2021 after a knee block was placed in the holding room by Anesthesia.  She was placed on the operating table in supine position.  She received antibiotics preoperatively  for prophylaxis.  After being placed under general anesthesia, her left knee was examined.  She had full range of motion.  Knee was stable to ligamentous exam with normal patellar tracking.  The left leg was prepped using sterile DuraPrep and draped  using sterile technique.  Timeout procedure was called and the correct left knee was identified.  Initially through an anterolateral portal, the arthroscope with a pump attached was placed and through an anteromedial portal, an arthroscopic probe was  placed.  On initial inspection of the medial compartment, she had 30% grade 3 chondromalacia, which was debrided.  Medial meniscus was thoroughly and completely  probed and there was no evidence of a tear.  There was a thickened impinging portion of  synovitis in the medial gutter and compartment, which was thoroughly debrided.  ACL and PCL was normal.  Lateral compartment articular cartilage was normal.  Lateral meniscus was normal.  I completely evaluated the posterior and lateral aspect of the  lateral femoral condyle and I could not find any significant OCD lesion posterolaterally on the lateral femoral condyle, which was evidence that this area should have been healed at this time.  The patellofemoral joint articular cartilage was intact.   The patella tracked normally.  There was moderate synovitis in the medial and lateral gutters, which was thoroughly debrided; otherwise, they were free of pathology.  At this point, I felt that all pathology had been satisfactorily addressed.  The  instruments were removed.  Portals were closed with 3-0 nylon suture and the knee injected with 80 mg of Depo-Medrol and 10 mL of Marcaine with epinephrine.  Sterile dressings were applied and the patient was awakened and taken to recovery room in stable  condition.  FOLLOWUP CARE:  The patient will be followed as an outpatient on Norco for pain.  I will see her back in office in a week for sutures out and followup.   NIK D: 07/05/2021 7:35:23 am T: 07/05/2021 9:05:00 am  JOB: [id number]/ [id number]

## 2023-11-01 ENCOUNTER — Other Ambulatory Visit: Payer: Self-pay | Admitting: Endocrinology

## 2023-11-01 DIAGNOSIS — E041 Nontoxic single thyroid nodule: Secondary | ICD-10-CM

## 2023-11-01 DIAGNOSIS — R1319 Other dysphagia: Secondary | ICD-10-CM

## 2023-11-08 ENCOUNTER — Ambulatory Visit
Admission: RE | Admit: 2023-11-08 | Discharge: 2023-11-08 | Disposition: A | Discharge status: Home / Self Care | Source: Ambulatory Visit | Attending: Endocrinology | Admitting: Endocrinology

## 2023-11-08 ENCOUNTER — Other Ambulatory Visit (HOSPITAL_COMMUNITY)
Admission: RE | Admit: 2023-11-08 | Discharge: 2023-11-08 | Disposition: A | Discharge status: Home / Self Care | Source: Ambulatory Visit | Attending: Endocrinology | Admitting: Endocrinology

## 2023-11-08 DIAGNOSIS — E041 Nontoxic single thyroid nodule: Secondary | ICD-10-CM | POA: Insufficient documentation

## 2023-11-10 LAB — CYTOLOGY - NON PAP

## 2023-11-14 ENCOUNTER — Ambulatory Visit
Admission: RE | Admit: 2023-11-14 | Discharge: 2023-11-14 | Discharge status: Home / Self Care | Source: Ambulatory Visit | Attending: Endocrinology | Admitting: Endocrinology

## 2023-11-14 DIAGNOSIS — R1319 Other dysphagia: Secondary | ICD-10-CM

## 2023-11-21 ENCOUNTER — Other Ambulatory Visit

## 2024-03-16 ENCOUNTER — Other Ambulatory Visit: Payer: Self-pay | Admitting: Medical Genetics

## 2024-05-21 ENCOUNTER — Other Ambulatory Visit: Payer: Self-pay | Admitting: Medical Genetics

## 2024-05-21 DIAGNOSIS — Z006 Encounter for examination for normal comparison and control in clinical research program: Secondary | ICD-10-CM
